# Patient Record
Sex: Female | Born: 1945 | Race: White | Hispanic: No | Smoking: Former smoker
Health system: Southern US, Community
[De-identification: ages and names within clinical notes are randomized; demographics above are authoritative.]

## PROBLEM LIST (undated history)

## (undated) DIAGNOSIS — E669 Obesity, unspecified: Secondary | ICD-10-CM

## (undated) DIAGNOSIS — D235 Other benign neoplasm of skin of trunk: Secondary | ICD-10-CM

## (undated) DIAGNOSIS — I5032 Chronic diastolic (congestive) heart failure: Secondary | ICD-10-CM

## (undated) DIAGNOSIS — I10 Essential (primary) hypertension: Secondary | ICD-10-CM

## (undated) DIAGNOSIS — F329 Major depressive disorder, single episode, unspecified: Secondary | ICD-10-CM

## (undated) DIAGNOSIS — E785 Hyperlipidemia, unspecified: Secondary | ICD-10-CM

## (undated) DIAGNOSIS — F32A Depression, unspecified: Secondary | ICD-10-CM

## (undated) DIAGNOSIS — N83209 Unspecified ovarian cyst, unspecified side: Secondary | ICD-10-CM

## (undated) DIAGNOSIS — K5792 Diverticulitis of intestine, part unspecified, without perforation or abscess without bleeding: Secondary | ICD-10-CM

## (undated) DIAGNOSIS — T7840XA Allergy, unspecified, initial encounter: Secondary | ICD-10-CM

## (undated) HISTORY — DX: Diverticulitis of intestine, part unspecified, without perforation or abscess without bleeding: K57.92

## (undated) HISTORY — DX: Major depressive disorder, single episode, unspecified: F32.9

## (undated) HISTORY — DX: Hyperlipidemia, unspecified: E78.5

## (undated) HISTORY — PX: CHOLECYSTECTOMY: SHX55

## (undated) HISTORY — PX: ABDOMINAL HYSTERECTOMY: SHX81

## (undated) HISTORY — DX: Essential (primary) hypertension: I10

## (undated) HISTORY — DX: Allergy, unspecified, initial encounter: T78.40XA

## (undated) HISTORY — DX: Other benign neoplasm of skin of trunk: D23.5

## (undated) HISTORY — DX: Depression, unspecified: F32.A

## (undated) HISTORY — DX: Unspecified ovarian cyst, unspecified side: N83.209

---

## 1981-06-06 HISTORY — PX: ANKLE FRACTURE SURGERY: SHX122

## 1995-09-05 HISTORY — PX: DOBUTAMINE STRESS ECHO: SHX5426

## 1998-06-06 HISTORY — PX: KNEE CARTILAGE SURGERY: SHX688

## 2003-08-01 ENCOUNTER — Encounter: Payer: Self-pay | Admitting: Internal Medicine

## 2004-04-09 ENCOUNTER — Ambulatory Visit: Payer: Self-pay | Admitting: Internal Medicine

## 2004-04-20 ENCOUNTER — Ambulatory Visit: Payer: Self-pay | Admitting: Professional

## 2004-05-04 ENCOUNTER — Ambulatory Visit: Payer: Self-pay | Admitting: Professional

## 2004-05-04 ENCOUNTER — Ambulatory Visit: Payer: Self-pay | Admitting: Internal Medicine

## 2004-05-11 ENCOUNTER — Ambulatory Visit: Payer: Self-pay | Admitting: Professional

## 2004-05-18 ENCOUNTER — Ambulatory Visit: Payer: Self-pay | Admitting: Professional

## 2004-05-25 ENCOUNTER — Ambulatory Visit: Payer: Self-pay | Admitting: Professional

## 2004-05-28 ENCOUNTER — Ambulatory Visit: Payer: Self-pay | Admitting: Internal Medicine

## 2004-06-15 ENCOUNTER — Ambulatory Visit: Payer: Self-pay | Admitting: Professional

## 2004-06-29 ENCOUNTER — Ambulatory Visit: Payer: Self-pay | Admitting: Professional

## 2004-07-05 ENCOUNTER — Ambulatory Visit (HOSPITAL_COMMUNITY): Admission: RE | Admit: 2004-07-05 | Discharge: 2004-07-05 | Payer: Self-pay | Admitting: Gynecology

## 2004-07-13 ENCOUNTER — Ambulatory Visit: Payer: Self-pay | Admitting: Professional

## 2004-07-15 ENCOUNTER — Ambulatory Visit: Payer: Self-pay | Admitting: Internal Medicine

## 2004-07-20 ENCOUNTER — Ambulatory Visit: Payer: Self-pay | Admitting: Professional

## 2004-08-03 ENCOUNTER — Ambulatory Visit: Payer: Self-pay | Admitting: Professional

## 2004-08-10 ENCOUNTER — Ambulatory Visit: Payer: Self-pay | Admitting: Professional

## 2004-08-24 ENCOUNTER — Ambulatory Visit: Payer: Self-pay | Admitting: Professional

## 2004-08-30 ENCOUNTER — Ambulatory Visit: Payer: Self-pay | Admitting: Internal Medicine

## 2004-08-31 ENCOUNTER — Ambulatory Visit: Payer: Self-pay | Admitting: Professional

## 2004-09-07 ENCOUNTER — Ambulatory Visit: Payer: Self-pay | Admitting: Professional

## 2004-09-21 ENCOUNTER — Ambulatory Visit: Payer: Self-pay | Admitting: Professional

## 2004-09-28 ENCOUNTER — Ambulatory Visit: Payer: Self-pay | Admitting: Professional

## 2004-10-05 ENCOUNTER — Ambulatory Visit: Payer: Self-pay | Admitting: Professional

## 2004-10-12 ENCOUNTER — Ambulatory Visit: Payer: Self-pay | Admitting: Professional

## 2004-10-19 ENCOUNTER — Ambulatory Visit: Payer: Self-pay | Admitting: Professional

## 2004-10-26 ENCOUNTER — Ambulatory Visit: Payer: Self-pay | Admitting: Professional

## 2004-11-02 ENCOUNTER — Ambulatory Visit: Payer: Self-pay | Admitting: Professional

## 2004-11-09 ENCOUNTER — Ambulatory Visit: Payer: Self-pay | Admitting: Professional

## 2004-11-23 ENCOUNTER — Ambulatory Visit: Payer: Self-pay | Admitting: Professional

## 2004-12-14 ENCOUNTER — Ambulatory Visit: Payer: Self-pay | Admitting: Professional

## 2004-12-20 ENCOUNTER — Ambulatory Visit: Payer: Self-pay | Admitting: Internal Medicine

## 2004-12-28 ENCOUNTER — Ambulatory Visit: Payer: Self-pay | Admitting: Professional

## 2005-01-04 ENCOUNTER — Ambulatory Visit: Payer: Self-pay | Admitting: Professional

## 2005-01-11 ENCOUNTER — Ambulatory Visit: Payer: Self-pay | Admitting: Professional

## 2005-01-25 ENCOUNTER — Ambulatory Visit: Payer: Self-pay | Admitting: Professional

## 2005-02-01 ENCOUNTER — Ambulatory Visit: Payer: Self-pay | Admitting: Professional

## 2005-02-15 ENCOUNTER — Ambulatory Visit: Payer: Self-pay | Admitting: Professional

## 2005-03-01 ENCOUNTER — Ambulatory Visit: Payer: Self-pay | Admitting: Professional

## 2005-03-06 HISTORY — PX: INCONTINENCE SURGERY: SHX676

## 2005-03-15 ENCOUNTER — Ambulatory Visit: Payer: Self-pay | Admitting: Professional

## 2005-03-16 ENCOUNTER — Encounter: Payer: Self-pay | Admitting: Internal Medicine

## 2005-03-16 ENCOUNTER — Ambulatory Visit (HOSPITAL_COMMUNITY): Admission: RE | Admit: 2005-03-16 | Discharge: 2005-03-16 | Payer: Self-pay | Admitting: Gynecology

## 2005-03-18 ENCOUNTER — Encounter (INDEPENDENT_AMBULATORY_CARE_PROVIDER_SITE_OTHER): Payer: Self-pay | Admitting: Specialist

## 2005-03-19 ENCOUNTER — Inpatient Hospital Stay (HOSPITAL_COMMUNITY): Admission: RE | Admit: 2005-03-19 | Discharge: 2005-03-20 | Payer: Self-pay | Admitting: Gynecology

## 2005-03-23 ENCOUNTER — Ambulatory Visit: Payer: Self-pay | Admitting: Internal Medicine

## 2005-03-29 ENCOUNTER — Ambulatory Visit: Payer: Self-pay | Admitting: Professional

## 2005-04-07 ENCOUNTER — Ambulatory Visit: Payer: Self-pay | Admitting: Internal Medicine

## 2005-04-12 ENCOUNTER — Ambulatory Visit: Payer: Self-pay | Admitting: Professional

## 2005-05-03 ENCOUNTER — Ambulatory Visit: Payer: Self-pay | Admitting: Professional

## 2005-05-09 ENCOUNTER — Ambulatory Visit: Payer: Self-pay | Admitting: Cardiology

## 2005-05-10 ENCOUNTER — Ambulatory Visit: Payer: Self-pay | Admitting: Internal Medicine

## 2005-05-10 ENCOUNTER — Ambulatory Visit: Payer: Self-pay

## 2005-05-17 ENCOUNTER — Ambulatory Visit: Payer: Self-pay | Admitting: Professional

## 2005-06-06 DIAGNOSIS — D235 Other benign neoplasm of skin of trunk: Secondary | ICD-10-CM

## 2005-06-06 HISTORY — DX: Other benign neoplasm of skin of trunk: D23.5

## 2005-06-07 ENCOUNTER — Ambulatory Visit: Payer: Self-pay | Admitting: Professional

## 2005-06-21 ENCOUNTER — Ambulatory Visit: Payer: Self-pay | Admitting: Professional

## 2005-07-05 ENCOUNTER — Ambulatory Visit: Payer: Self-pay | Admitting: Professional

## 2005-07-12 ENCOUNTER — Ambulatory Visit: Payer: Self-pay | Admitting: Professional

## 2005-08-02 ENCOUNTER — Ambulatory Visit: Payer: Self-pay | Admitting: Professional

## 2005-08-16 ENCOUNTER — Ambulatory Visit: Payer: Self-pay | Admitting: Professional

## 2005-08-30 ENCOUNTER — Ambulatory Visit: Payer: Self-pay | Admitting: Professional

## 2005-09-07 ENCOUNTER — Ambulatory Visit (HOSPITAL_COMMUNITY): Admission: RE | Admit: 2005-09-07 | Discharge: 2005-09-07 | Payer: Self-pay | Admitting: Gynecology

## 2005-09-13 ENCOUNTER — Ambulatory Visit: Payer: Self-pay | Admitting: Professional

## 2005-09-27 ENCOUNTER — Ambulatory Visit: Payer: Self-pay | Admitting: Professional

## 2005-10-11 ENCOUNTER — Ambulatory Visit: Payer: Self-pay | Admitting: Professional

## 2005-10-17 ENCOUNTER — Ambulatory Visit: Payer: Self-pay | Admitting: Internal Medicine

## 2005-11-01 ENCOUNTER — Ambulatory Visit: Payer: Self-pay | Admitting: Internal Medicine

## 2005-11-08 ENCOUNTER — Ambulatory Visit: Payer: Self-pay | Admitting: Professional

## 2005-12-06 ENCOUNTER — Ambulatory Visit: Payer: Self-pay | Admitting: Professional

## 2005-12-27 ENCOUNTER — Ambulatory Visit: Payer: Self-pay | Admitting: Professional

## 2006-01-10 ENCOUNTER — Ambulatory Visit: Payer: Self-pay | Admitting: Pulmonary Disease

## 2006-01-17 ENCOUNTER — Ambulatory Visit: Payer: Self-pay | Admitting: Professional

## 2006-01-30 ENCOUNTER — Ambulatory Visit: Payer: Self-pay | Admitting: Internal Medicine

## 2006-01-31 ENCOUNTER — Ambulatory Visit: Payer: Self-pay | Admitting: Internal Medicine

## 2006-02-07 ENCOUNTER — Ambulatory Visit: Payer: Self-pay | Admitting: Professional

## 2006-02-14 ENCOUNTER — Ambulatory Visit (HOSPITAL_BASED_OUTPATIENT_CLINIC_OR_DEPARTMENT_OTHER): Admission: RE | Admit: 2006-02-14 | Discharge: 2006-02-14 | Payer: Self-pay | Admitting: Pulmonary Disease

## 2006-02-17 ENCOUNTER — Ambulatory Visit: Payer: Self-pay | Admitting: Pulmonary Disease

## 2006-02-22 ENCOUNTER — Ambulatory Visit: Payer: Self-pay | Admitting: Internal Medicine

## 2006-03-06 ENCOUNTER — Encounter: Payer: Self-pay | Admitting: Internal Medicine

## 2006-03-06 ENCOUNTER — Ambulatory Visit: Payer: Self-pay | Admitting: Pulmonary Disease

## 2006-03-14 ENCOUNTER — Ambulatory Visit: Payer: Self-pay | Admitting: Professional

## 2006-03-15 ENCOUNTER — Encounter: Payer: Self-pay | Admitting: Internal Medicine

## 2006-03-28 ENCOUNTER — Ambulatory Visit: Payer: Self-pay | Admitting: Pulmonary Disease

## 2006-04-04 ENCOUNTER — Ambulatory Visit: Payer: Self-pay | Admitting: Professional

## 2006-05-25 ENCOUNTER — Ambulatory Visit: Payer: Self-pay | Admitting: Internal Medicine

## 2006-06-12 ENCOUNTER — Ambulatory Visit: Payer: Self-pay | Admitting: Professional

## 2006-06-20 ENCOUNTER — Ambulatory Visit: Payer: Self-pay | Admitting: Professional

## 2006-06-23 ENCOUNTER — Ambulatory Visit: Payer: Self-pay | Admitting: Internal Medicine

## 2006-07-25 ENCOUNTER — Ambulatory Visit: Payer: Self-pay | Admitting: Internal Medicine

## 2006-07-25 LAB — CONVERTED CEMR LAB
BUN: 15 mg/dL (ref 6–23)
Basophils Absolute: 0 10*3/uL (ref 0.0–0.1)
Basophils Relative: 0.6 % (ref 0.0–1.0)
CO2: 34 meq/L — ABNORMAL HIGH (ref 19–32)
Calcium: 10 mg/dL (ref 8.4–10.5)
Chloride: 100 meq/L (ref 96–112)
Creatinine, Ser: 0.9 mg/dL (ref 0.4–1.2)
Eosinophils Absolute: 0.3 10*3/uL (ref 0.0–0.6)
Eosinophils Relative: 3.8 % (ref 0.0–5.0)
GFR calc Af Amer: 83 mL/min
GFR calc non Af Amer: 68 mL/min
Glucose, Bld: 110 mg/dL — ABNORMAL HIGH (ref 70–99)
HCT: 41 % (ref 36.0–46.0)
Hemoglobin: 14.1 g/dL (ref 12.0–15.0)
Lymphocytes Relative: 20.6 % (ref 12.0–46.0)
MCHC: 34.4 g/dL (ref 30.0–36.0)
MCV: 83.9 fL (ref 78.0–100.0)
Monocytes Absolute: 0.5 10*3/uL (ref 0.2–0.7)
Monocytes Relative: 6.9 % (ref 3.0–11.0)
Neutro Abs: 5.4 10*3/uL (ref 1.4–7.7)
Neutrophils Relative %: 68.1 % (ref 43.0–77.0)
Platelets: 350 10*3/uL (ref 150–400)
Potassium: 4.6 meq/L (ref 3.5–5.1)
Pro B Natriuretic peptide (BNP): 23 pg/mL (ref 0.0–100.0)
RBC: 4.88 M/uL (ref 3.87–5.11)
RDW: 13.4 % (ref 11.5–14.6)
Sodium: 143 meq/L (ref 135–145)
WBC: 7.8 10*3/uL (ref 4.5–10.5)

## 2006-08-03 ENCOUNTER — Ambulatory Visit: Payer: Self-pay

## 2006-08-03 ENCOUNTER — Encounter: Payer: Self-pay | Admitting: Cardiology

## 2006-08-08 ENCOUNTER — Ambulatory Visit: Payer: Self-pay | Admitting: Professional

## 2006-08-22 ENCOUNTER — Ambulatory Visit: Payer: Self-pay | Admitting: Internal Medicine

## 2006-09-15 ENCOUNTER — Ambulatory Visit: Payer: Self-pay | Admitting: Family Medicine

## 2006-10-16 DIAGNOSIS — N83209 Unspecified ovarian cyst, unspecified side: Secondary | ICD-10-CM | POA: Insufficient documentation

## 2006-10-16 DIAGNOSIS — F329 Major depressive disorder, single episode, unspecified: Secondary | ICD-10-CM

## 2006-10-16 DIAGNOSIS — I10 Essential (primary) hypertension: Secondary | ICD-10-CM | POA: Insufficient documentation

## 2006-10-16 DIAGNOSIS — K5730 Diverticulosis of large intestine without perforation or abscess without bleeding: Secondary | ICD-10-CM | POA: Insufficient documentation

## 2006-10-16 DIAGNOSIS — F3289 Other specified depressive episodes: Secondary | ICD-10-CM | POA: Insufficient documentation

## 2006-10-16 DIAGNOSIS — J309 Allergic rhinitis, unspecified: Secondary | ICD-10-CM | POA: Insufficient documentation

## 2006-10-23 ENCOUNTER — Ambulatory Visit: Payer: Self-pay | Admitting: Internal Medicine

## 2006-11-06 ENCOUNTER — Ambulatory Visit: Payer: Self-pay | Admitting: Pulmonary Disease

## 2006-12-05 ENCOUNTER — Ambulatory Visit: Payer: Self-pay | Admitting: Pulmonary Disease

## 2007-01-02 ENCOUNTER — Ambulatory Visit: Payer: Self-pay | Admitting: Professional

## 2007-01-08 ENCOUNTER — Ambulatory Visit: Payer: Self-pay | Admitting: Internal Medicine

## 2007-01-09 ENCOUNTER — Ambulatory Visit: Payer: Self-pay | Admitting: Internal Medicine

## 2007-01-12 LAB — CONVERTED CEMR LAB
BUN: 32 mg/dL — ABNORMAL HIGH (ref 6–23)
CO2: 32 meq/L (ref 19–32)
Calcium: 9.2 mg/dL (ref 8.4–10.5)
Chloride: 102 meq/L (ref 96–112)
Creatinine, Ser: 0.9 mg/dL (ref 0.4–1.2)
GFR calc Af Amer: 83 mL/min
GFR calc non Af Amer: 68 mL/min
Glucose, Bld: 129 mg/dL — ABNORMAL HIGH (ref 70–99)
Potassium: 4.1 meq/L (ref 3.5–5.1)
Sodium: 140 meq/L (ref 135–145)

## 2007-01-29 ENCOUNTER — Encounter: Admission: RE | Admit: 2007-01-29 | Discharge: 2007-01-29 | Payer: Self-pay | Admitting: Gynecology

## 2007-02-15 ENCOUNTER — Encounter: Payer: Self-pay | Admitting: Pulmonary Disease

## 2007-03-07 ENCOUNTER — Ambulatory Visit: Payer: Self-pay | Admitting: Internal Medicine

## 2007-04-09 ENCOUNTER — Telehealth (INDEPENDENT_AMBULATORY_CARE_PROVIDER_SITE_OTHER): Payer: Self-pay | Admitting: *Deleted

## 2007-04-10 DIAGNOSIS — G4733 Obstructive sleep apnea (adult) (pediatric): Secondary | ICD-10-CM | POA: Insufficient documentation

## 2007-04-20 ENCOUNTER — Encounter: Payer: Self-pay | Admitting: Internal Medicine

## 2007-05-07 ENCOUNTER — Telehealth: Payer: Self-pay | Admitting: Pulmonary Disease

## 2007-05-15 ENCOUNTER — Ambulatory Visit: Payer: Self-pay | Admitting: Professional

## 2007-05-22 ENCOUNTER — Encounter: Admission: RE | Admit: 2007-05-22 | Discharge: 2007-05-22 | Payer: Self-pay | Admitting: *Deleted

## 2007-05-22 ENCOUNTER — Ambulatory Visit (HOSPITAL_COMMUNITY): Admission: RE | Admit: 2007-05-22 | Discharge: 2007-05-22 | Payer: Self-pay | Admitting: *Deleted

## 2007-05-23 ENCOUNTER — Ambulatory Visit: Payer: Self-pay | Admitting: Pulmonary Disease

## 2007-05-24 ENCOUNTER — Ambulatory Visit (HOSPITAL_COMMUNITY): Admission: RE | Admit: 2007-05-24 | Discharge: 2007-05-24 | Payer: Self-pay | Admitting: *Deleted

## 2007-05-30 ENCOUNTER — Telehealth (INDEPENDENT_AMBULATORY_CARE_PROVIDER_SITE_OTHER): Payer: Self-pay | Admitting: *Deleted

## 2007-06-27 ENCOUNTER — Encounter: Payer: Self-pay | Admitting: Internal Medicine

## 2007-07-18 ENCOUNTER — Ambulatory Visit: Payer: Self-pay | Admitting: Internal Medicine

## 2007-08-28 ENCOUNTER — Encounter: Admission: RE | Admit: 2007-08-28 | Discharge: 2007-08-28 | Payer: Self-pay

## 2007-09-05 HISTORY — PX: LAPAROSCOPIC GASTRIC BANDING: SHX1100

## 2007-09-11 ENCOUNTER — Ambulatory Visit (HOSPITAL_COMMUNITY): Admission: RE | Admit: 2007-09-11 | Discharge: 2007-09-12 | Payer: Self-pay | Admitting: Surgery

## 2007-09-11 ENCOUNTER — Encounter: Payer: Self-pay | Admitting: Internal Medicine

## 2007-09-19 ENCOUNTER — Telehealth: Payer: Self-pay | Admitting: Internal Medicine

## 2007-11-16 ENCOUNTER — Ambulatory Visit: Payer: Self-pay | Admitting: Internal Medicine

## 2007-11-16 LAB — CONVERTED CEMR LAB
ALT: 22 units/L (ref 0–35)
AST: 26 units/L (ref 0–37)
Albumin: 3.9 g/dL (ref 3.5–5.2)
Alkaline Phosphatase: 51 units/L (ref 39–117)
BUN: 21 mg/dL (ref 6–23)
Bilirubin, Direct: 0.1 mg/dL (ref 0.0–0.3)
CO2: 30 meq/L (ref 19–32)
Calcium: 9.4 mg/dL (ref 8.4–10.5)
Chloride: 103 meq/L (ref 96–112)
Cholesterol: 205 mg/dL (ref 0–200)
Creatinine, Ser: 0.8 mg/dL (ref 0.4–1.2)
Direct LDL: 147.4 mg/dL
GFR calc Af Amer: 94 mL/min
GFR calc non Af Amer: 78 mL/min
Glucose, Bld: 114 mg/dL — ABNORMAL HIGH (ref 70–99)
HDL: 40.7 mg/dL (ref >39.0)
Potassium: 4.4 meq/L (ref 3.5–5.1)
Sodium: 142 meq/L (ref 135–145)
TSH: 2.04 microintl units/mL (ref 0.35–5.50)
Total Bilirubin: 0.9 mg/dL (ref 0.3–1.2)
Total CHOL/HDL Ratio: 5
Total Protein: 7.6 g/dL (ref 6.0–8.3)
Triglycerides: 129 mg/dL (ref 0–149)
VLDL: 26 mg/dL (ref 0–40)

## 2007-11-22 ENCOUNTER — Ambulatory Visit: Payer: Self-pay | Admitting: Internal Medicine

## 2007-11-22 DIAGNOSIS — R7301 Impaired fasting glucose: Secondary | ICD-10-CM | POA: Insufficient documentation

## 2008-02-27 ENCOUNTER — Encounter: Payer: Self-pay | Admitting: Internal Medicine

## 2008-04-11 ENCOUNTER — Encounter: Payer: Self-pay | Admitting: Pulmonary Disease

## 2008-04-11 ENCOUNTER — Telehealth: Payer: Self-pay | Admitting: Internal Medicine

## 2008-04-14 ENCOUNTER — Encounter: Payer: Self-pay | Admitting: Internal Medicine

## 2008-05-14 ENCOUNTER — Encounter (INDEPENDENT_AMBULATORY_CARE_PROVIDER_SITE_OTHER): Payer: Self-pay | Admitting: *Deleted

## 2008-06-24 ENCOUNTER — Encounter: Payer: Self-pay | Admitting: Internal Medicine

## 2008-06-24 ENCOUNTER — Ambulatory Visit (HOSPITAL_COMMUNITY): Admission: RE | Admit: 2008-06-24 | Discharge: 2008-06-24 | Payer: Self-pay | Admitting: Family Medicine

## 2008-07-14 ENCOUNTER — Ambulatory Visit: Payer: Self-pay | Admitting: Professional

## 2008-11-19 ENCOUNTER — Encounter: Admission: RE | Admit: 2008-11-19 | Discharge: 2008-11-19 | Payer: Self-pay | Admitting: Surgery

## 2008-11-29 ENCOUNTER — Encounter: Payer: Self-pay | Admitting: Internal Medicine

## 2008-12-11 ENCOUNTER — Telehealth: Payer: Self-pay | Admitting: Internal Medicine

## 2008-12-16 ENCOUNTER — Telehealth: Payer: Self-pay | Admitting: Internal Medicine

## 2008-12-26 ENCOUNTER — Ambulatory Visit: Payer: Self-pay | Admitting: Internal Medicine

## 2008-12-26 DIAGNOSIS — R32 Unspecified urinary incontinence: Secondary | ICD-10-CM | POA: Insufficient documentation

## 2008-12-30 LAB — CONVERTED CEMR LAB
ALT: 21 units/L (ref 0–35)
AST: 28 units/L (ref 0–37)
Albumin: 3.7 g/dL (ref 3.5–5.2)
Alkaline Phosphatase: 52 units/L (ref 39–117)
BUN: 20 mg/dL (ref 6–23)
Basophils Absolute: 0 10*3/uL (ref 0.0–0.1)
Basophils Relative: 0.7 % (ref 0.0–3.0)
Bilirubin, Direct: 0 mg/dL (ref 0.0–0.3)
CO2: 31 meq/L (ref 19–32)
Calcium: 9 mg/dL (ref 8.4–10.5)
Chloride: 107 meq/L (ref 96–112)
Cholesterol: 205 mg/dL — ABNORMAL HIGH (ref 0–200)
Creatinine, Ser: 0.9 mg/dL (ref 0.4–1.2)
Direct LDL: 142.6 mg/dL
Eosinophils Absolute: 0.3 10*3/uL (ref 0.0–0.7)
Eosinophils Relative: 4.8 % (ref 0.0–5.0)
Glucose, Bld: 98 mg/dL (ref 70–99)
HCT: 40.9 % (ref 36.0–46.0)
HDL: 37.6 mg/dL — ABNORMAL LOW (ref >39.00)
Hemoglobin: 13.9 g/dL (ref 12.0–15.0)
Lymphocytes Relative: 24.6 % (ref 12.0–46.0)
Lymphs Abs: 1.5 10*3/uL (ref 0.7–4.0)
MCHC: 33.9 g/dL (ref 30.0–36.0)
MCV: 87.5 fL (ref 78.0–100.0)
Monocytes Absolute: 0.5 10*3/uL (ref 0.1–1.0)
Monocytes Relative: 8.1 % (ref 3.0–12.0)
Neutro Abs: 3.7 10*3/uL (ref 1.4–7.7)
Neutrophils Relative %: 61.8 % (ref 43.0–77.0)
Phosphorus: 3.1 mg/dL (ref 2.3–4.6)
Platelets: 308 10*3/uL (ref 150.0–400.0)
Potassium: 4.5 meq/L (ref 3.5–5.1)
RBC: 4.67 M/uL (ref 3.87–5.11)
RDW: 14 % (ref 11.5–14.6)
Sodium: 142 meq/L (ref 135–145)
TSH: 1.29 microintl units/mL (ref 0.35–5.50)
Total Bilirubin: 1 mg/dL (ref 0.3–1.2)
Total CHOL/HDL Ratio: 5
Total Protein: 7.2 g/dL (ref 6.0–8.3)
Triglycerides: 219 mg/dL — ABNORMAL HIGH (ref 0.0–149.0)
VLDL: 43.8 mg/dL — ABNORMAL HIGH (ref 0.0–40.0)
WBC: 6 10*3/uL (ref 4.5–10.5)

## 2009-01-01 ENCOUNTER — Encounter: Payer: Self-pay | Admitting: Internal Medicine

## 2009-01-06 DIAGNOSIS — R0602 Shortness of breath: Secondary | ICD-10-CM | POA: Insufficient documentation

## 2009-02-16 ENCOUNTER — Ambulatory Visit: Payer: Self-pay | Admitting: Internal Medicine

## 2009-02-27 ENCOUNTER — Telehealth: Payer: Self-pay | Admitting: Internal Medicine

## 2009-03-16 ENCOUNTER — Ambulatory Visit: Payer: Self-pay | Admitting: Family Medicine

## 2009-03-16 DIAGNOSIS — J209 Acute bronchitis, unspecified: Secondary | ICD-10-CM | POA: Insufficient documentation

## 2009-04-16 ENCOUNTER — Telehealth: Payer: Self-pay | Admitting: Internal Medicine

## 2009-06-15 IMAGING — CR DG ABDOMEN 1V
2 series · 2 of 2 positions shown · non-contrast
Comparison: None

CLINICAL DATA: Postop gastric banding.

ABDOMEN - 1 VIEW

[t abdomen supine * (1 of 2)]
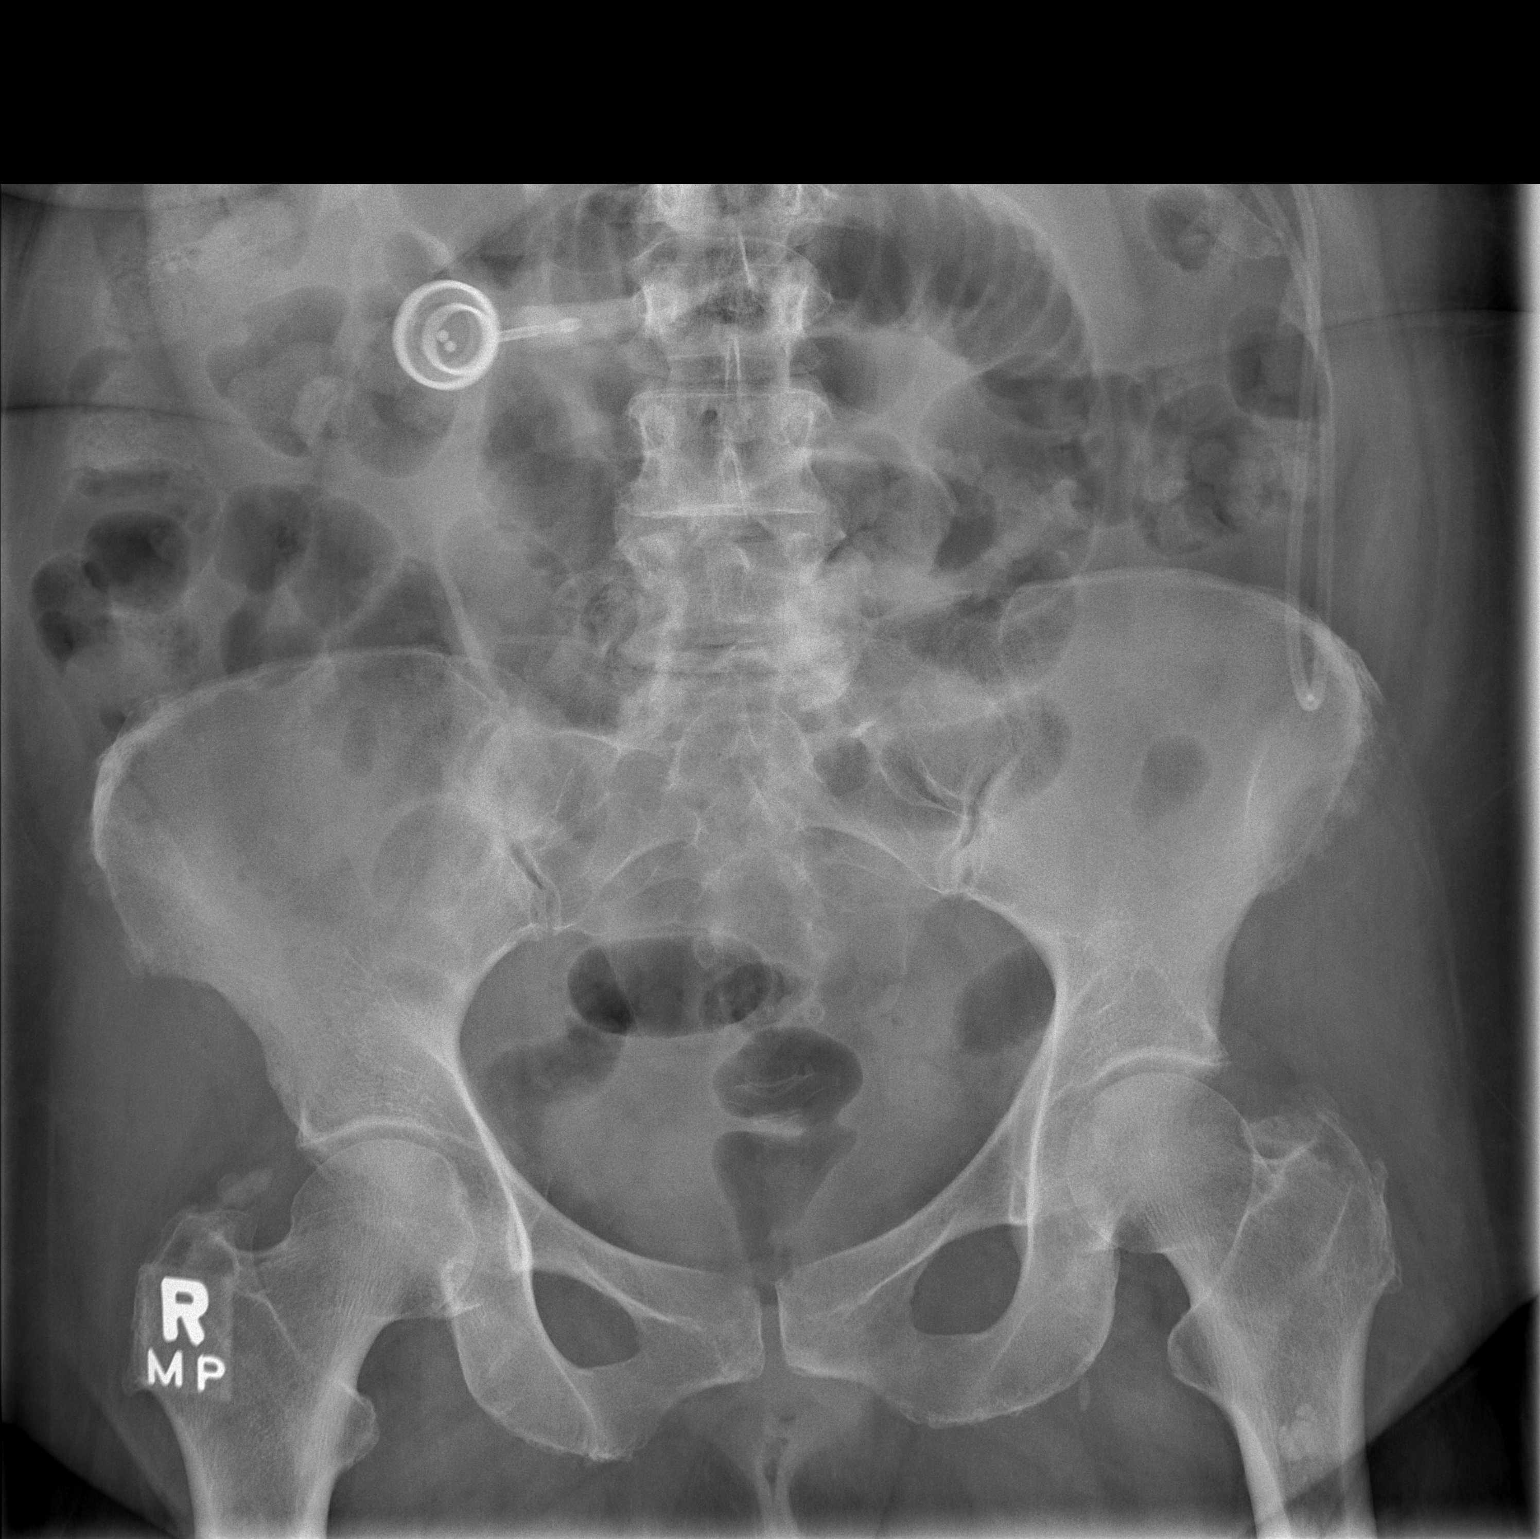

[t abdomen supine * (2 of 2)]
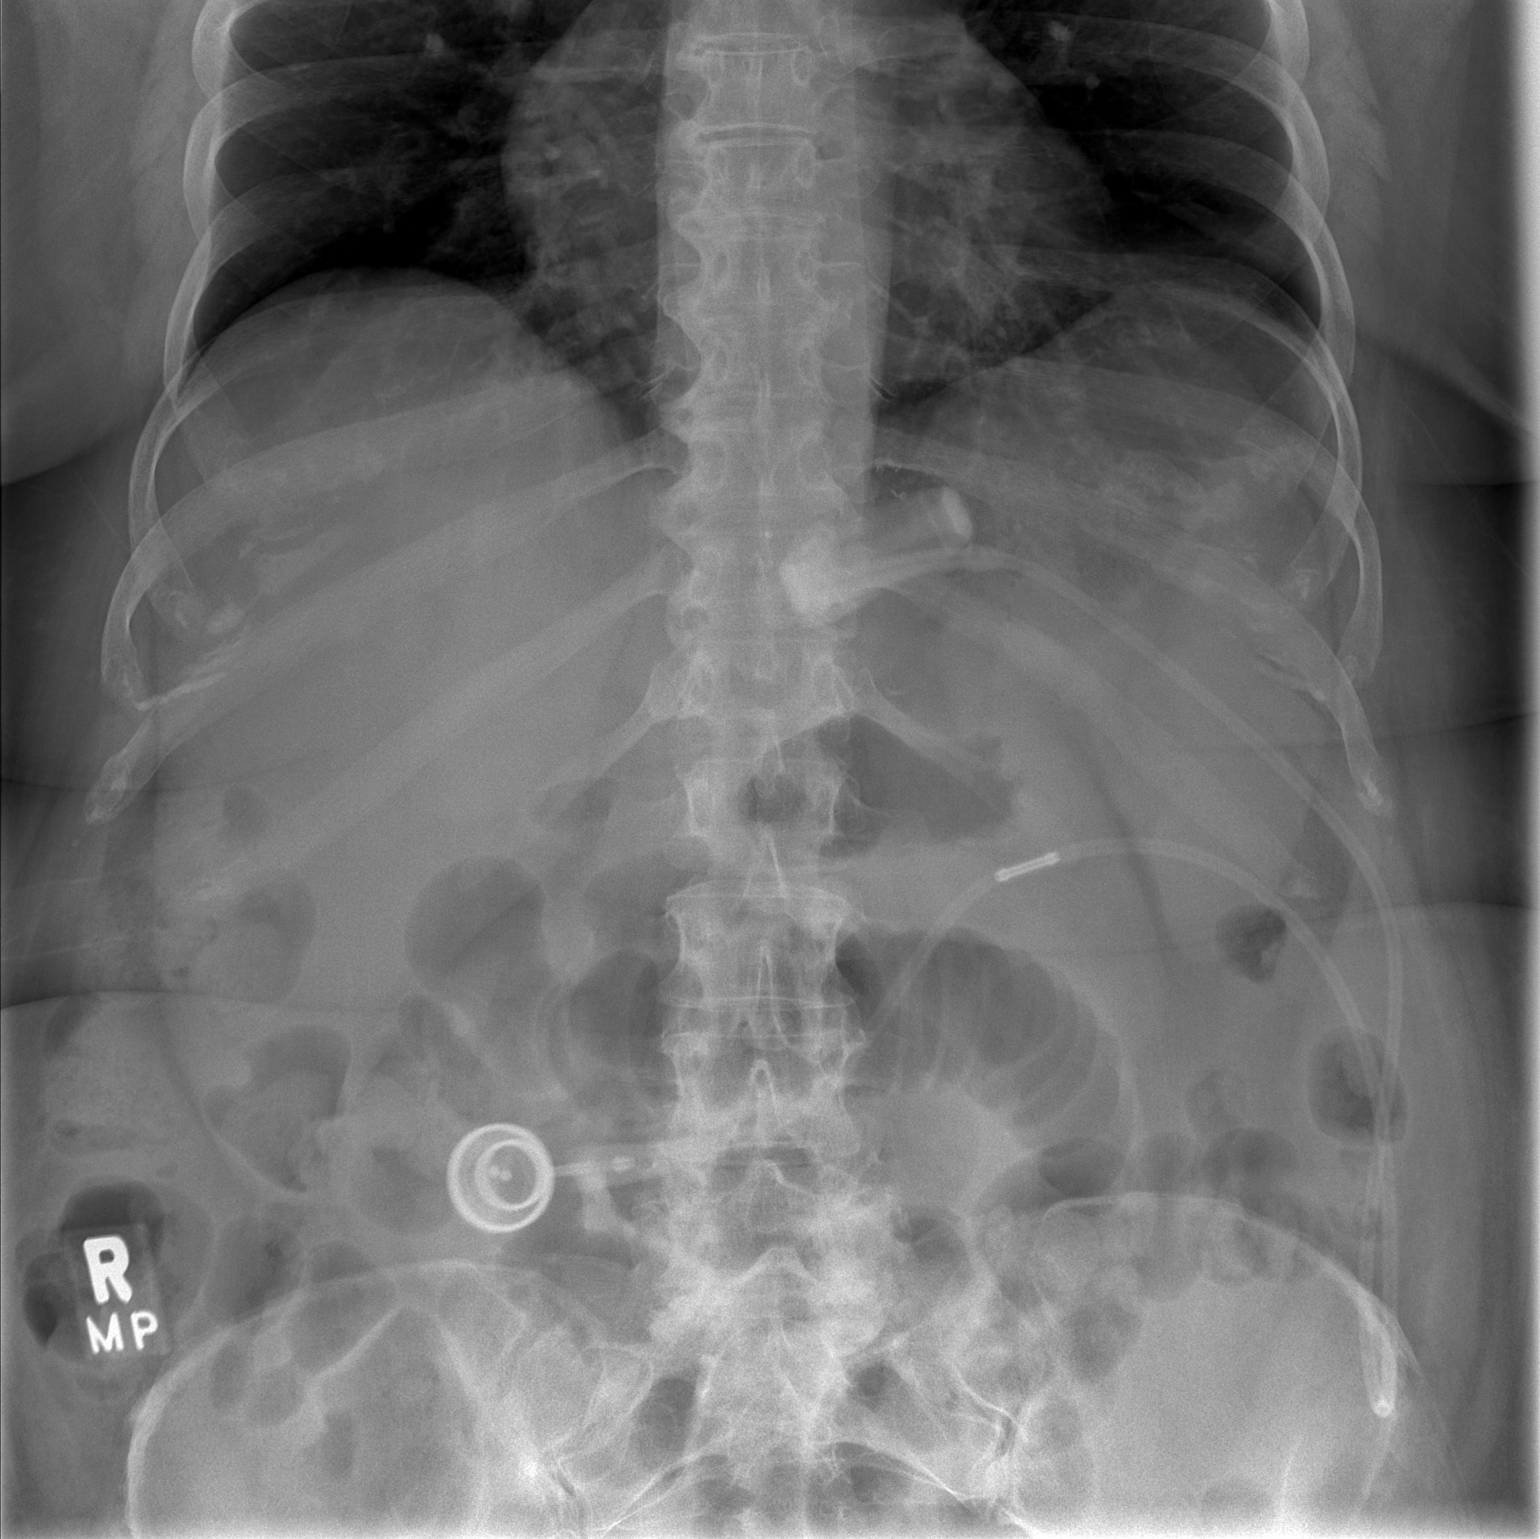

[2 of 2 positions shown; findings below may reference images not displayed]

FINDINGS: The patient is status post laparoscopic gastric banding
with band positions at the 2 o'clock and 8 o'clock positions.  Mild
gaseous distension of small bowel.  Gas and stool are seen in the
colon.
IMPRESSION: Gastric banding as above.

## 2009-06-17 ENCOUNTER — Ambulatory Visit: Payer: Self-pay | Admitting: Internal Medicine

## 2009-06-22 LAB — CONVERTED CEMR LAB
ALT: 20 units/L (ref 0–35)
AST: 22 units/L (ref 0–37)
Albumin: 4.4 g/dL (ref 3.5–5.2)
Alkaline Phosphatase: 52 units/L (ref 39–117)
BUN: 23 mg/dL (ref 6–23)
Basophils Absolute: 0.1 10*3/uL (ref 0.0–0.1)
Basophils Relative: 1.7 % (ref 0.0–3.0)
Bilirubin, Direct: 0.2 mg/dL (ref 0.0–0.3)
CO2: 28 meq/L (ref 19–32)
Calcium: 9.8 mg/dL (ref 8.4–10.5)
Chloride: 98 meq/L (ref 96–112)
Cholesterol: 231 mg/dL — ABNORMAL HIGH (ref 0–200)
Creatinine, Ser: 1.3 mg/dL — ABNORMAL HIGH (ref 0.4–1.2)
Direct LDL: 174.4 mg/dL
Eosinophils Absolute: 0.1 10*3/uL (ref 0.0–0.7)
Eosinophils Relative: 1.4 % (ref 0.0–5.0)
GFR calc non Af Amer: 44.21 mL/min (ref >60)
Glucose, Bld: 91 mg/dL (ref 70–99)
HCT: 40.3 % (ref 36.0–46.0)
HDL: 40.3 mg/dL (ref >39.00)
Hemoglobin: 13.5 g/dL (ref 12.0–15.0)
Hgb A1c MFr Bld: 5.4 % (ref 4.6–6.5)
Lymphocytes Relative: 15.7 % (ref 12.0–46.0)
Lymphs Abs: 1.3 10*3/uL (ref 0.7–4.0)
MCHC: 33.5 g/dL (ref 30.0–36.0)
MCV: 86.2 fL (ref 78.0–100.0)
Monocytes Absolute: 0.3 10*3/uL (ref 0.1–1.0)
Monocytes Relative: 3.6 % (ref 3.0–12.0)
Neutro Abs: 6.5 10*3/uL (ref 1.4–7.7)
Neutrophils Relative %: 77.6 % — ABNORMAL HIGH (ref 43.0–77.0)
Phosphorus: 5.2 mg/dL — ABNORMAL HIGH (ref 2.3–4.6)
Platelets: 307 10*3/uL (ref 150.0–400.0)
Potassium: 4.9 meq/L (ref 3.5–5.1)
RBC: 4.68 M/uL (ref 3.87–5.11)
RDW: 14.2 % (ref 11.5–14.6)
Sodium: 137 meq/L (ref 135–145)
TSH: 2.03 microintl units/mL (ref 0.35–5.50)
Total Bilirubin: 1.3 mg/dL — ABNORMAL HIGH (ref 0.3–1.2)
Total CHOL/HDL Ratio: 6
Total Protein: 7.7 g/dL (ref 6.0–8.3)
Triglycerides: 135 mg/dL (ref 0.0–149.0)
VLDL: 27 mg/dL (ref 0.0–40.0)
WBC: 8.3 10*3/uL (ref 4.5–10.5)

## 2009-06-29 ENCOUNTER — Ambulatory Visit (HOSPITAL_COMMUNITY): Admission: RE | Admit: 2009-06-29 | Discharge: 2009-06-29 | Payer: Self-pay | Admitting: Internal Medicine

## 2009-06-29 LAB — HM MAMMOGRAPHY

## 2009-06-30 ENCOUNTER — Encounter: Payer: Self-pay | Admitting: Internal Medicine

## 2009-10-23 ENCOUNTER — Ambulatory Visit: Payer: Self-pay | Admitting: Internal Medicine

## 2009-10-23 DIAGNOSIS — J019 Acute sinusitis, unspecified: Secondary | ICD-10-CM | POA: Insufficient documentation

## 2010-03-01 ENCOUNTER — Encounter: Payer: Self-pay | Admitting: Internal Medicine

## 2010-03-22 ENCOUNTER — Telehealth: Payer: Self-pay | Admitting: Internal Medicine

## 2010-05-17 ENCOUNTER — Encounter: Payer: Self-pay | Admitting: Internal Medicine

## 2010-05-17 ENCOUNTER — Ambulatory Visit: Payer: Self-pay | Admitting: Internal Medicine

## 2010-05-17 DIAGNOSIS — M2550 Pain in unspecified joint: Secondary | ICD-10-CM | POA: Insufficient documentation

## 2010-05-19 LAB — CONVERTED CEMR LAB
ANA Titer 1: 1:320 {titer} — ABNORMAL HIGH
Anti Nuclear Antibody(ANA): POSITIVE — AB
Rheumatoid fact SerPl-aCnc: 34 intl units/mL — ABNORMAL HIGH (ref 0–20)

## 2010-05-20 LAB — CONVERTED CEMR LAB
ALT: 19 units/L (ref 0–35)
AST: 19 units/L (ref 0–37)
Albumin: 3.8 g/dL (ref 3.5–5.2)
Alkaline Phosphatase: 56 units/L (ref 39–117)
BUN: 17 mg/dL (ref 6–23)
Basophils Absolute: 0.1 10*3/uL (ref 0.0–0.1)
Basophils Relative: 1.1 % (ref 0.0–3.0)
Bilirubin, Direct: 0.1 mg/dL (ref 0.0–0.3)
CO2: 33 meq/L — ABNORMAL HIGH (ref 19–32)
Calcium: 9.7 mg/dL (ref 8.4–10.5)
Chloride: 101 meq/L (ref 96–112)
Creatinine, Ser: 0.7 mg/dL (ref 0.4–1.2)
Eosinophils Absolute: 0.2 10*3/uL (ref 0.0–0.7)
Eosinophils Relative: 3.7 % (ref 0.0–5.0)
GFR calc non Af Amer: 91.55 mL/min (ref >60.00)
Glucose, Bld: 89 mg/dL (ref 70–99)
HCT: 41.1 % (ref 36.0–46.0)
Hemoglobin: 14.2 g/dL (ref 12.0–15.0)
Lymphocytes Relative: 20.8 % (ref 12.0–46.0)
Lymphs Abs: 1.3 10*3/uL (ref 0.7–4.0)
MCHC: 34.6 g/dL (ref 30.0–36.0)
MCV: 84.9 fL (ref 78.0–100.0)
Monocytes Absolute: 0.4 10*3/uL (ref 0.1–1.0)
Monocytes Relative: 6.6 % (ref 3.0–12.0)
Neutro Abs: 4.2 10*3/uL (ref 1.4–7.7)
Neutrophils Relative %: 67.8 % (ref 43.0–77.0)
Phosphorus: 3.5 mg/dL (ref 2.3–4.6)
Platelets: 284 10*3/uL (ref 150.0–400.0)
Potassium: 4.5 meq/L (ref 3.5–5.1)
RBC: 4.84 M/uL (ref 3.87–5.11)
RDW: 14.3 % (ref 11.5–14.6)
Sodium: 144 meq/L (ref 135–145)
Total Bilirubin: 0.7 mg/dL (ref 0.3–1.2)
Total Protein: 7.1 g/dL (ref 6.0–8.3)
WBC: 6.3 10*3/uL (ref 4.5–10.5)

## 2010-06-06 HISTORY — PX: CARPAL TUNNEL RELEASE: SHX101

## 2010-06-15 ENCOUNTER — Ambulatory Visit: Payer: Self-pay | Admitting: Specialist

## 2010-06-21 ENCOUNTER — Ambulatory Visit: Payer: Self-pay | Admitting: Specialist

## 2010-06-26 ENCOUNTER — Encounter: Payer: Self-pay | Admitting: Gynecology

## 2010-06-27 ENCOUNTER — Encounter: Payer: Self-pay | Admitting: Gynecology

## 2010-07-06 NOTE — Assessment & Plan Note (Signed)
Summary: SINUS SYMPTOMS/ 9:45   Vital Signs:  Patient profile:   65 year old female Weight:      192 pounds Temp:     97.7 degrees F oral Resp:     14 per minute BP sitting:   110 / 60  (left arm) Cuff size:   large  Vitals Entered By: Mervin Hack CMA Duncan Dull) (Oct 23, 2009 10:06 AM) CC: sinus   History of Present Illness: Having cough recent sinus problems while in [place] Plans flying trip and then cruise and wants to make sure she is okay  Went to urgent care in [place] about 2 weeks ago given antibiotic for sinus (may have been sulfa)  Cough has been persistent Occ productive of mucus but generally loose  No sore throat--just slightly irritated Nasal discharge and PND  No fever but did have some chills initially---not now  has held cozaar for a week due to dizziness doing fine off it  Allergies: No Known Drug Allergies  Past History:  Past medical, surgical, family and social histories (including risk factors) reviewed for relevance to current acute and chronic problems.  Past Medical History: Reviewed history from 02/16/2009 and no changes required. Allergic rhinitis Depression Diverticulosis, colon Hyperlipidemia Hypertension Ovarian cyst Urinary incontinence  Past Surgical History: Reviewed history from 09/19/2007 and no changes required. Cholecystectomy, 1974 Hysterectomy/ bladder tack 10/06 Tubal/ bladder tacking 1979 Left ankle fracture- screw 1983 Vaginal deliveries x 3 Left knee meniscus- torn- no surger 2000 Stress ECHO 04/97 Aden. Celine Ahr, negative EF 68% 02/08 ECHO- 1 diast. dys. fx. EF normal 02/08 4/09 Lap band gastroplasty  Family History: Reviewed history from 10/16/2006 and no changes required. Father: Died at age 60, bone cancer Mother: alive COPD Siblings: One brother, testicular cancer, one sister CAD-- some on mom's side Parkinson's-Pat. uncle No DM  Social History: Reviewed history from 01/06/2009 and no changes  required. 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   Head:  mild frontal tenderness but no maxillary tenderness Ears:  R ear normal and L ear normal.   Nose:  Moderate congestion Mouth:  no erythema and no exudates.   Neck:  supple, no masses, and no cervical lymphadenopathy.   Lungs:  normal respiratory effort, no intercostal retractions, no accessory muscle use, normal breath sounds, no crackles, and no wheezes.     Impression & Recommendations:  Problem # 1:  SINUSITIS - ACUTE-NOS (ICD-461.9) Assessment New  was treated but may not be cleared hard to tell continue supportive care  start augmentin if not clearing up  Her updated medication list for this problem includes:    Amoxicillin-pot Clavulanate 875-125 Mg Tabs (Amoxicillin-pot clavulanate) .Marland Kitchen... 1 tab by mouth two times a day after eating for sinus infection  Complete Medication List: 1)  Paroxetine Hcl 20 Mg Tabs (Paroxetine hcl) .... Take 1/2  by mouth daily 2)  Requip 1 Mg Tabs (Ropinirole hydrochloride) .... Take one by mouth  at bedtime 3)  Triamterene-hctz 37.5-25 Mg Tabs (Triamterene-hctz) .... Take one every  day 4)  Furosemide 20 Mg Tabs (Furosemide) .... Take 1 tablet by mouth once a day as needed 5)  Toviaz 4 Mg Xr24h-tab (Fesoterodine fumarate) .Marland Kitchen.. 1 by mouth daily for overactive bladder 6)  Meclizine Hcl 25 Mg Tabs (Meclizine hcl) .Marland Kitchen.. 1 tab by mouth three times a day as needed to prevent motion sickness 7)  Amoxicillin-pot Clavulanate 875-125 Mg Tabs (Amoxicillin-pot clavulanate) .Marland Kitchen.. 1 tab by mouth two times a day after eating for sinus  infection  Patient Instructions: 1)  Please start the antibiotic if you are not improving over the next few days 2)  Please keep your regular follow up Prescriptions: AMOXICILLIN-POT CLAVULANATE 875-125 MG TABS (AMOXICILLIN-POT CLAVULANATE) 1 tab by mouth two times a day after eating for sinus infection  #20 x 0   Entered and Authorized by:   Cindee Salt MD   Signed by:   Cindee Salt MD on 10/23/2009   Method used:   Electronically to        [facility]* (retail)       [address]       [place], [place]  [address]       Ph: [phone]       Fax: [phone]   RxID:   [id number] MECLIZINE HCL 25 MG TABS (MECLIZINE HCL) 1 tab by mouth three times a day as needed to prevent motion sickness  #60 x 1   Entered and Authorized by:   Cindee Salt MD   Signed by:   Cindee Salt MD on 10/23/2009   Method used:   Electronically to        [facility]* (retail)       [address]       [place], [place]  [address]       Ph: [phone]       Fax: [phone]   RxID:   [id number]   Current Allergies (reviewed today): No known allergies

## 2010-07-06 NOTE — Letter (Signed)
Summary: [facility]   Imported By: Maryln Gottron 03/08/2010 15:08:45  _____________________________________________________________________  External Attachment:    Type:   Image     Comment:   External Document  Appended Document: [facility] right carpal tunnel--injected and splint given right shoulder impingement--trying mobic

## 2010-07-06 NOTE — Assessment & Plan Note (Signed)
Summary: 2:15CPX/DLO   Vital Signs:  Patient profile:   65 year old female Weight:      208 pounds Temp:     98.3 degrees F oral Pulse rate:   72 / minute Pulse rhythm:   regular BP sitting:   108 / 68  (left arm) Cuff size:   large  Vitals Entered By: Mervin Hack CMA Duncan Dull) (June 17, 2009 2:20 PM) CC: adult physical   History of Present Illness: Doing fairly well No further respiratory illnesses  Taking homeopathic HCG preparation still has band in place as well No swallowing problems or other issues with this Following strict dietary recommendations   Still has occ problems initiating sleep Advil PM really helps --she uses this as needed   Allergies: No Known Drug Allergies  Past History:  Past medical, surgical, family and social histories (including risk factors) reviewed for relevance to current acute and chronic problems.  Past Medical History: Reviewed history from 02/16/2009 and no changes required. Allergic rhinitis Depression Diverticulosis, colon Hyperlipidemia Hypertension Ovarian cyst Urinary incontinence  Past Surgical History: Reviewed history from 09/19/2007 and no changes required. Cholecystectomy, 1974 Hysterectomy/ bladder tack 10/06 Tubal/ bladder tacking 1979 Left ankle fracture- screw 1983 Vaginal deliveries x 3 Left knee meniscus- torn- no surger 2000 Stress ECHO 04/97 Aden. Celine Ahr, negative EF 68% 02/08 ECHO- 1 diast. dys. fx. EF normal 02/08 4/09 Lap band gastroplasty  Family History: Reviewed history from 10/16/2006 and no changes required. Father: Died at age 62, bone cancer Mother: alive COPD Siblings: One brother, testicular cancer, one sister CAD-- some on mom's side Parkinson's-Pat. uncle No DM  Social History: Reviewed history from 01/06/2009 and no changes required. 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 Eyes:  Denies blurring, double vision, and vision loss-1 eye. ENT:  Denies decreased hearing and ringing in ears; teeth okay--regular with dentist. CV:  Complains of lightheadness; denies chest pain or discomfort, difficulty breathing at night, difficulty breathing while lying down, fainting, palpitations, and shortness of breath with exertion; occ mild orthostatic dizziness. Resp:  Denies cough and shortness of breath. GI:  Complains of indigestion; denies abdominal pain, bloody stools, change in bowel habits, dark tarry stools, nausea, and vomiting; heartburn with chocolate. GU:  Complains of incontinence and urinary frequency; denies dysuria; Tries to limit using the toviaz---didn't really seem to help that much discussed safe sex . MS:  Complains of joint pain; denies joint swelling; mild arthritic symptoms in fingers and toes no meds. Derm:  Denies lesion(s) and rash. Neuro:  Complains of numbness and tingling; denies headaches and weakness; hand symptoms due to carpal tunnel. Psych:  Denies anxiety and depression. Heme:  Denies abnormal bruising and enlarge lymph nodes. Allergy:  Denies seasonal allergies and sneezing; rhinorrhea--not clearly allergic.  Physical Exam  General:  alert and normal appearance.   Eyes:  pupils equal, pupils round, pupils reactive to light, and no optic disk abnormalities.   Ears:  R ear normal and L ear normal.   Mouth:  no erythema and no exudates.   Neck:  supple, no masses, no thyromegaly, no carotid bruits, and no cervical lymphadenopathy.   Breasts:  no masses, no abnormal thickening, no tenderness, and no adenopathy.  Lungs:  normal respiratory effort and normal breath sounds.   Heart:  normal rate, regular rhythm, no murmur, and no gallop.   Abdomen:   soft, non-tender, and no masses.   Msk:  no joint tenderness and no joint swelling.   Pulses:  1+ in feet Extremities:  no sig edema Neurologic:  alert & oriented X3, strength normal in all extremities, and gait normal.   Skin:  no rashes and no suspicious lesions.   Psych:  normally interactive, good eye contact, not anxious appearing, and not depressed appearing.     Impression & Recommendations:  Problem # 1:  PREVENTIVE HEALTH CARE (ICD-V70.0) Assessment Comment Only doing great continuing with proper eating and exercise will schedule mammo due for colon next year  Problem # 2:  HYPERTENSION (ICD-401.9) Assessment: Improved  If persistent orthostatic changes, will cut losartan in past  Her updated medication list for this problem includes:    Cozaar 100 Mg Tabs (Losartan potassium) .Marland Kitchen... Take one by mouth daily    Triamterene-hctz 37.5-25 Mg Tabs (Triamterene-hctz) .Marland Kitchen... Take one every  day    Furosemide 20 Mg Tabs (Furosemide) .Marland Kitchen... Take 1 tablet by mouth once a day as needed  BP today: 108/68 Prior BP: 126/72 (03/16/2009)  Labs Reviewed: K+: 4.5 (12/26/2008) Creat: : 0.9 (12/26/2008)   Chol: 205 (12/26/2008)   HDL: 37.60 (12/26/2008)   LDL: DEL (11/16/2007)   TG: 219.0 (12/26/2008)  Orders: TLB-Renal Function Panel (80069-RENAL) TLB-CBC Platelet - w/Differential (85025-CBCD) TLB-TSH (Thyroid Stimulating Hormone) (84443-TSH) Venipuncture ([address])  Problem # 3:  HYPERLIPIDEMIA (MILD) (ICD-272.4) Assessment: Unchanged  will recheck with the weight loss  Labs Reviewed: SGOT: 28 (12/26/2008)   SGPT: 21 (12/26/2008)   HDL:37.60 (12/26/2008), 40.7 (11/16/2007)  LDL:DEL (11/16/2007)  Chol:205 (12/26/2008), 205 (11/16/2007)  Trig:219.0 (12/26/2008), 129 (11/16/2007)  Orders: TLB-Lipid Panel (80061-LIPID) TLB-Hepatic/Liver Function Pnl (80076-HEPATIC)  Problem # 4:  DEPRESSION (ICD-311) Assessment: Unchanged doing well will continue the med for now but lower  dose  Her updated medication list for this problem includes:    Paroxetine Hcl 20 Mg Tabs (Paroxetine hcl) .Marland Kitchen... Take 1/2  by mouth daily  Complete Medication List: 1)  Paroxetine Hcl 20 Mg Tabs (Paroxetine hcl) .... Take 1/2  by mouth daily 2)  Cozaar 100 Mg Tabs (Losartan potassium) .... Take one by mouth daily 3)  Requip 1 Mg Tabs (Ropinirole hydrochloride) .... Take one by mouth  at bedtime 4)  Triamterene-hctz 37.5-25 Mg Tabs (Triamterene-hctz) .... Take one every  day 5)  Furosemide 20 Mg Tabs (Furosemide) .... Take 1 tablet by mouth once a day as needed 6)  Toviaz 4 Mg Xr24h-tab (Fesoterodine fumarate) .Marland Kitchen.. 1 by mouth daily for overactive bladder  Other Orders: TLB-A1C / Hgb A1C (Glycohemoglobin) (83036-A1C) Radiology Referral (Radiology)  Patient Instructions: 1)  Please schedule a follow-up appointment in 6 months .  2)  Schedule your mammogram.   Current Allergies (reviewed today): No known allergies

## 2010-07-06 NOTE — Letter (Signed)
Summary: Results Follow up Letter  [place] [facility]  [address] [place], [place] [address]   Phone: [phone]  Fax: [phone]    06/30/2009 MRN: [id number]  [facility] [address] [place], [place]  [address]  Dear Ms. Boehringer,  The following are the results of your recent test(s):  Test         Result    Pap Smear:        Normal _____  Not Normal _____ Comments: ______________________________________________________ Cholesterol: LDL(Bad cholesterol):         Your goal is less than:         HDL (Good cholesterol):       Your goal is more than: Comments:  ______________________________________________________ Mammogram:        Normal __X___  Not Normal _____ Comments: Repeat in 1 year  ___________________________________________________________________ Hemoccult:        Normal _____  Not normal _______ Comments:    _____________________________________________________________________ Other Tests:    We routinely do not discuss normal results over the telephone.  If you desire a copy of the results, or you have any questions about this information we can discuss them at your next office visit.   Sincerely,      Tillman Abide, MD

## 2010-07-06 NOTE — Progress Notes (Signed)
Summary: requip  Phone Note Refill Request Message from:  Fax from Pharmacy on March 22, 2010 2:04 PM  Refills Requested: Medication #1:  REQUIP 1 MG TABS Take one by mouth  at bedtime   Last Refilled: 02/13/2010 Refill request from [facility]. [phone]. Form is on your desk.   Initial call taken by: Melody Comas,  March 22, 2010 2:05 PM  Follow-up for Phone Call        Rx completed in Dr. Tiajuana Amass Follow-up by: Cindee Salt MD,  March 22, 2010 5:32 PM    New/Updated Medications: REQUIP 1 MG TABS (ROPINIROLE HYDROCHLORIDE) Take one by mouth  at bedtime Prescriptions: REQUIP 1 MG TABS (ROPINIROLE HYDROCHLORIDE) Take one by mouth  at bedtime  #30 x 11   Entered and Authorized by:   Cindee Salt MD   Signed by:   Cindee Salt MD on 03/22/2010   Method used:   Electronically to        [facility]* (retail)       [address]       [place], [place]  [address]       Ph: [phone]       Fax: [phone]   RxID:   [id number]

## 2010-07-08 NOTE — Assessment & Plan Note (Signed)
Summary: 6 MONTH FOLLOW UP/RBH   Vital Signs:  Patient profile:   65 year old female Weight:      226 pounds BMI:     41.82 Temp:     98.2 degrees F oral Pulse rate:   68 / minute Pulse rhythm:   regular BP sitting:   140 / 80  (left arm) Cuff size:   large  Vitals Entered By: Mervin Hack CMA Duncan Dull) (May 17, 2010 12:29 PM) CC: 6 month follow-up   History of Present Illness: Has had a lot of emotional issues This affected her eating and she put a lot of weight back on Was betrayed by a close friend Cat is sick---companion for 15 years  SHe had never cut the paroxetine down--surprised by refill with lower dose  Having more hand pain--and also in feet worried about RA---son has this takes advil 8-10 tabs a day---- does "take the edge off" Not sleeping well Not really stiff in morning if she takes advil at night  Just restarted back on the weight control program new job now working longer hours --  3-9PM. This is affecting schedule  No chest pain No SOB  Has been holding off on toviaz still with some incontinence so wants to try it again  Allergies: No Known Drug Allergies  Past History:  Past medical, surgical, family and social histories (including risk factors) reviewed for relevance to current acute and chronic problems.  Past Medical History: Reviewed history from 02/16/2009 and no changes required. Allergic rhinitis Depression Diverticulosis, colon Hyperlipidemia Hypertension Ovarian cyst Urinary incontinence  Past Surgical History: Reviewed history from 09/19/2007 and no changes required. Cholecystectomy, 1974 Hysterectomy/ bladder tack 10/06 Tubal/ bladder tacking 1979 Left ankle fracture- screw 1983 Vaginal deliveries x 3 Left knee meniscus- torn- no surger 2000 Stress ECHO 04/97 Aden. Celine Ahr, negative EF 68% 02/08 ECHO- 1 diast. dys. fx. EF normal 02/08 4/09 Lap band gastroplasty  Family History: Reviewed history from 10/16/2006  and no changes required. Father: Died at age 39, bone cancer Mother: alive COPD Siblings: One brother, testicular cancer, one sister CAD-- some on mom's side Parkinson's-Pat. uncle No DM  Social History: Reviewed history from 01/06/2009 and no changes required. 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 Uses melatonin very stiff after getting up after sitting a while Takes natural muscle relaxer and magnesium No set exercise  Physical Exam  General:  alert and normal appearance.   Neck:  supple, no masses, no thyromegaly, no carotid bruits, and no cervical lymphadenopathy.   Lungs:  normal respiratory effort, no intercostal retractions, no accessory muscle use, and normal breath sounds.   Heart:  normal rate, regular rhythm, no murmur, and no gallop.   Msk:  no sig hand synoviitis mild swelling in ankles---could be edema or in joints Extremities:  trace edema at ankles Psych:  normally interactive, good eye contact, not anxious appearing, and dysphoric affect.     Impression & Recommendations:  Problem # 1:  DEPRESSION (ICD-311) Assessment Deteriorated lots of stress needs the higher dose--had never really cut down  Her updated medication list for this problem includes:    Paroxetine Hcl 20 Mg Tabs (Paroxetine hcl) .Marland Kitchen... Take  1  by mouth daily  Problem # 2:  HYPERTENSION (ICD-401.9) Assessment: Unchanged  good control on meds  Her updated medication list for this problem includes:    Triamterene-hctz 37.5-25 Mg Tabs (Triamterene-hctz) .Marland Kitchen... Take one every  day    Furosemide 20 Mg Tabs (Furosemide) .Marland Kitchen... Take 1 tablet by mouth once a day as  needed  BP today: 140/80 Prior BP: 110/60 (10/23/2009)  Labs Reviewed: K+: 4.9 (06/17/2009) Creat: : 1.3 (06/17/2009)   Chol: 231 (06/17/2009)   HDL: 40.30 (06/17/2009)   LDL: DEL (11/16/2007)   TG: 135.0 (06/17/2009)  Orders: TLB-Renal Function Panel (80069-RENAL) TLB-CBC Platelet - w/Differential (85025-CBCD) TLB-Hepatic/Liver Function Pnl (80076-HEPATIC) TLB-TSH (Thyroid Stimulating Hormone) (84443-TSH) Venipuncture (16[address])  Problem # 3:  PAIN IN JOINT, MULTIPLE SITES (ICD-719.49) Assessment: Deteriorated  probably underuse and osteoarthritis given son's history, will check RF and ANA using the advil needs to increase exercise again  Orders: Specimen Handling ([address]) T-Antinuclear Antib (ANA) [id number]) T-Rheumatoid Factor (29562-13086)  Problem # 4:  URINARY INCONTINENCE (ICD-788.30) Assessment: Deteriorated wants to restart the toviaz  Problem # 5:  OBESITY, MORBID (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 Take 1  by mouth daily 2)  Requip 1 Mg Tabs (Ropinirole hydrochloride) .... Take one by mouth  at bedtime 3)  Triamterene-hctz 37.5-25 Mg Tabs (Triamterene-hctz) .... Take one every  day 4)  Furosemide 20 Mg Tabs (Furosemide) .... Take 1 tablet by mouth once a day as needed 5)  Toviaz 4 Mg Xr24h-tab (Fesoterodine fumarate) .Marland Kitchen.. 1 by mouth daily for overactive bladder  Patient Instructions: 1)  Please schedule a follow-up appointment in 3 months .  Prescriptions: TOVIAZ 4 MG XR24H-TAB (FESOTERODINE FUMARATE) 1 by mouth daily for overactive bladder  #90 x 3   Entered and Authorized by:   Cindee Salt MD   Signed by:   Cindee Salt MD on 05/17/2010   Method used:   Electronically to        [facility]* (retail)       [address]       [place], [place]  [address]       Ph: [phone]       Fax: [phone]   RxID:    [id number] PAROXETINE HCL 20 MG TABS (PAROXETINE HCL) Take 1  by mouth daily  #90 x 3   Entered and Authorized by:   Cindee Salt MD   Signed by:   Cindee Salt MD on 05/17/2010   Method used:   Electronically to        [facility]* (retail)       [address]       [place], [place]  [address]       Ph: [phone]       Fax: [phone]   RxID:   [id number]    Orders Added: 1)  Est. Patient Level IV [[address]] 2)  Specimen Handling [99000] 3)  T-Antinuclear Antib (ANA) [76283-15176] 4)  T-Rheumatoid Factor [16073-71062] 5)  TLB-Renal Function Panel [80069-RENAL] 6)  TLB-CBC Platelet - w/Differential [85025-CBCD] 7)  TLB-Hepatic/Liver Function Pnl [80076-HEPATIC] 8)  TLB-TSH (Thyroid Stimulating Hormone) [84443-TSH] 9)  Venipuncture [69485]    Current Allergies (reviewed today): No known allergies

## 2010-08-16 ENCOUNTER — Ambulatory Visit: Payer: Self-pay | Admitting: Internal Medicine

## 2010-09-01 ENCOUNTER — Other Ambulatory Visit: Payer: Self-pay | Admitting: *Deleted

## 2010-09-01 NOTE — Telephone Encounter (Signed)
Okay to refill x 1 year Can be electronic

## 2010-09-02 MED ORDER — PAROXETINE HCL 20 MG PO TABS: ORAL_TABLET | ORAL | Status: DC

## 2010-09-02 NOTE — Telephone Encounter (Signed)
rx sent to pharmacy, e-script

## 2010-09-07 ENCOUNTER — Ambulatory Visit: Payer: Self-pay | Admitting: Internal Medicine

## 2010-10-01 ENCOUNTER — Encounter: Payer: Self-pay | Admitting: Internal Medicine

## 2010-10-04 ENCOUNTER — Ambulatory Visit: Payer: Self-pay | Admitting: Internal Medicine

## 2010-10-19 NOTE — Assessment & Plan Note (Signed)
[facility] OFFICE NOTE   Katrina, Andrews                       MRN:          [id number]  DATE:01/08/2007                            DOB:          07-Jan-1946    PRIMARY CARE PHYSICIAN:  Dr. Alphonsus Andrews.   INTERVAL HISTORY:  [name] is a delightful 65 year old woman with a  history of obesity, hypertension, sleep apnea who returns today for  routine followup.   Overall she is doing well.  We had started her on some p.r.n. Lasix for  some lower extremity edema.  She is taking this 2 or 3 times a day with  good result.  She does note occasional cramping.  She is not taking  potassium.  She says her breathing is much better.  She denies any chest  pain.  She has been following her blood pressure at various doctor's  offices.  Her systolics have been running in the 115-125 range.   She continues to have some difficulty with her separation from her  husband and is trying to take more time for herself.   CURRENT MEDICATIONS:  1. Paxil 20 a day.  2. ReQuip 1 mg q.h.s.  3. Sanctura.  4. Ambien CR 12.5 a day at night.  5. Aspirin 81.  6. Vitamin B6.  7. Triamterene HCTZ 37.5/25.  8. Cozaar 100 a day.  9. Multivitamin.   PHYSICAL EXAM:  She is well-appearing, no acute distress.  Ambulates  around the clinic without any respiratory difficulty.  Blood pressure is  124/76, heart rate 72, weight is 249.  HEENT:  Normal.  NECK:  Supple, no JVD.  Carotids are 2+ bilaterally without any bruits.  There is no lymphadenopathy or thyromegaly.  CARDIAC:  PMI is nonpalpable.  She has a regular rate and rhythm with an  S4, no murmur.  LUNGS:  Clear.  ABDOMEN:  Obese, nontender, nondistended.  No bruits or masses.  Unable  to appreciate any hepatosplenomegaly.  Good bowel sounds.  EXTREMITIES:  Warm with no cyanosis, clubbing or edema.  NEURO:  Alert and oriented x3.  Cranial nerves II-XII are intact.  Moves  all 4  extremities without difficulty.  Affect is normal.   ASSESSMENT AND PLAN:  1. Hypertension, well controlled.  Continue current therapy.  2. Dyspnea.  This is resolved.  Stress test and echocardiogram looked      good.   DISPOSITION:  1. She will return to clinic in 6 months for routine followup.  2. We will also check a BMET today to make sure her potassium is      stable.  I did tell her to take a potassium every time she takes a      Lasix dose.     Katrina Buckles. Bensimhon, MD  Electronically Signed    DRB/MedQ  DD: 01/08/2007  DT: 01/08/2007  Job #: [id number]   cc:   Katrina Schwalbe, MD

## 2010-10-19 NOTE — Assessment & Plan Note (Signed)
[facility] OFFICE NOTE   Katrina Andrews, Katrina Andrews                       MRN:          [id number]  DATE:10/23/2006                            DOB:          1945/10/19    Cardiology Clinic Note from [facility]   INTERVAL HISTORY:  Ms. Linden is a delightful 65 year old woman who is a  friend of Jimmy Footman.  She has a history of morbid obesity,  hypertension, sleep apnea, who returns today for routine followup.  She  just had a normal echocardiogram and with grade I diastolic dysfunction  and normal nuclear study with an EF of 68% with no scar or ischemia.   Overall, she says she is doing fairly well.  Her dyspnea is somewhat  improved but not resolved.  She does have some mild lower extremity  edema which she attributes to dietary non-compliance.  She is taking the  Requip but unfortunately has not followed up with Dr. Shelle Iron for her  CPAP.   Her main complaint today is that of emotional stress that she is going  through a separation with her previous husband of 37 years.   CURRENT MEDICATIONS:  1. Phentermine 30 mg every other day.  2. Requip 1 mg nightly.  3. Sanctura 120 mg nightly.  4. Cozaar 100 mg daily.  5. Triamterene/hydrochlorothiazide 37.5/25.  6. Aspirin 81.  7. Fish oil.   PHYSICAL EXAMINATION:  She ambulates around the clinic without any  respiratory difficulty.  Blood pressure is 132/78 with a heart rate of 82.  Weight is 256, which  is up 6 pounds.  HEENT:  Is normal.  NECK:  Is supple.  Thick.  Difficult to evaluate JVD but appears flat.  Carotids are 2+ bilaterally without any bruits.  There is no  lymphadenopathy or thyromegaly.  CARDIAC:  Shows a regular rate and rhythm with a soft S4, soft 2/6  systolic ejection murmur at the left sternal border.  LUNGS:  Are clear  ABDOMEN:  Is obese, nontender, nondistended.  No hepatosplenomegaly, no  bruits, no masses appreciated.  Good bowel  sounds.  EXTREMITIES:  Are warm with no cyanosis or clubbing.  There is trace to  1+ edema bilaterally.  No rashes.  NEURO:  Alert and oriented x3.  Cranial nerves II through XII are  intact.  Affect is normal.  Moves all 4 extremities without difficulty.   ASSESSMENT AND PLAN:  1. Hypertension.  Blood pressure is still mildly elevated.  I did      suggest the possibility of adding Norvasc 2.5 but she is concerned      that this will worsen her edema.  She would like to hold off at      this point.  I did tell her if her blood pressure remains elevated      at followup, then we need to reconsider this.  2. Mild lower extremity edema.  We gave her a prescription for Lasix      20 mg p.r.n. to take also with potassium 20 mEq p.r.n.  3. Obstructive sleep apnea.  I  have asked her to followup with Dr.      Shelle Iron to hopefully be initiated on CPAP.     Bevelyn Buckles. Bensimhon, MD  Electronically Signed    DRB/MedQ  DD: 10/23/2006  DT: 10/23/2006  Job #: [id number]

## 2010-10-19 NOTE — Op Note (Signed)
NAMEMARGARINE, Katrina Andrews              ACCOUNT NO.:  [id number]   MEDICAL RECORD NO.:  [id number]          PATIENT TYPE:  OIB   LOCATION:  0098                         FACILITY:  [facility]   PHYSICIAN:  Thornton Park. Daphine Deutscher, MD  DATE OF BIRTH:  1946/01/03   DATE OF PROCEDURE:  DATE OF DISCHARGE:                               OPERATIVE REPORT   PREOPERATIVE DIAGNOSES:  1. Morbid obesity, body mass index 47.  2. High blood pressure.  3. Sleep apnea.   POSTOPERATIVE DIAGNOSES:  1. Morbid obesity, body mass index 47.  2. High blood pressure.  3. Sleep apnea.   PROCEDURE:  Laparoscopic adjustable gastric banding (Allergan APL  system) with closure of the esophageal hiatus posteriorly using  Endostitch.   SURGEON:  Dr. Luretha Murphy.   ASSISTANT:  Dr. Ovidio Kin.   ANESTHESIA:  General endotracheal.   DESCRIPTION OF PROCEDURE:  Katrina Andrews was taken to room 1 on September 11, 2007  and given general anesthesia.  The abdomen was prepped with Techni-Care  and draped sterilely. He entered the abdomen through the left upper  quadrant using an OptiVu without difficulty and insufflated.  She had  multiple adhesions in the midline from her previous midline  cholecystectomy.  The second trocar was placed to the left the umbilicus  and through that introduced scissors and took down these adhesions.  Standard ports were placed in the upper abdomen and the somewhat plump  left lateral segment was retracted.  She had some adhesions up there and  I went ahead and freed those up.  She had a lot of proximal foregut  adiposity.  Upper GI had shown a small sliding hiatal hernia and she did  admit to some symptoms of reflux.  Therefore I went ahead and went  posteriorly and identified the right and left crus and it looked like  she might have some fatty infiltration in her distal esophagus but I was  unable to identify a discrete lipoma.  Once this was done, I went ahead  and put a single Endostitch  approximating the posterior leaves of the  crura right and left tying this together and placing a tie knot on them.   I then plucked the band passer from a little farther down where the fat  striped across the right crus and the came up over by the left crus.  The APL band had been pulled as I had previously done the sizing with  the balloon which did not necessarily go up into the esophagus with 15  mL but did with 10.  The band was then passed around and snapped in  place and plicated with three sutures, again securing with tie knots.  I  put Tisseel over the completed band plication, brought the tubing into  the lower port on the right attaching it to a port.  This was sutured to  the  anterior fascia with interrupted sutures of #0 Prolene.  The wounds were  all irrigated and were closed with 4-0 Vicryl subcutaneously and  subcuticularly with Dermabond.  The patient was taken to the recovery  room  in satisfactory condition.      Thornton Park Daphine Deutscher, MD  Electronically Signed     MBM/MEDQ  D:  09/11/2007  T:  09/11/2007  Job:  [id number]   cc:   Karie Schwalbe, MD  [address] [place], [place] [address]   Bevelyn Buckles. Bensimhon, MD  [address] [place] [address]

## 2010-10-19 NOTE — Assessment & Plan Note (Signed)
[facility] OFFICE NOTE   KEARIE, Katrina Andrews                       MRN:          [id number]  DATE:07/18/2007                            DOB:          12-05-1945    PRIMARY CARE PHYSICIAN:  Katrina Andrews Schwalbe, MD   INTERVAL HISTORY:  Ms. Caulder is a delightful 64 year old woman with a  history of obesity, hypertension, sleep apnea and previous dyspnea.  She  had a Myoview and an echocardiogram back in February 2008, both of which  were normal.  She returns today for routine follow-up.   She says she is doing very well.  She continues to exercise quite  vigorously 3 days a week without any problems.  Her blood pressure has  been under good control.  She has not had any further problem with  dyspnea.  She has made the decision to proceed with bariatric surgery  and is scheduled for the lap band later this month.   CURRENT MEDICATIONS:  1. Paxil 20 mg a day.  2. Requip 1 mg at night.  3. Aspirin 81.  4. Multivitamin,.  5. Triamterene/HCTZ 37.5/25.  6. Cozaar 100 a day.  7. Calcium plus vitamin D.  8. Vesicare 10 mg a day.   PHYSICAL EXAMINATION:  GENERAL:  She is well-appearing, no acute  distress, ambulates around the clinic without respiratory difficulty.  VITAL SIGNS:  Blood pressure is 106/76, heart rate 78, weight is 259.  HEENT:  Normal.  NECK:  Supple.  No JVD.  Carotid 2+ bilateral bruits.  There is no  lymphadenopathy or thyromegaly.  CARDIAC:  PMI is not palpable.  She is regular rate and rhythm, S4, no  murmur.  LUNGS:  Clear.  ABDOMEN:  Obese, nontender, nondistended, no bruits, no masses.  No  obvious hepatosplenomegaly.  Good bowel sounds.  EXTREMITIES:  Warm with no sinus clubbing or edema.  No rash.  NEUROLOGICAL:  Alert, oriented x3.  Cranial nerves II-XII intact.  Moves  all four extremities without difficulty.  Affect is normal.   STUDIES:  EKG shows normal sinus rhythm at a rate of 78.   No ST-T wave  abnormalities.  All the intervals are normal.   ASSESSMENT/PLAN:  1. Hypertension, well controlled.  Continue current therapy.  2. Dyspnea.  This is resolved.  Stress test and echocardiogram looked      good.  3. Preoperative surgical clearance.  She is low risk for any      perioperative cardiac complications and would not recommend any      further cardiac workup prior to her surgery.  If you have any      questions, please do not hesitate to call me.     Bevelyn Buckles. Bensimhon, MD  Electronically Signed    DRB/MedQ  DD: 07/18/2007  DT: 07/19/2007  Job #: [id number]   cc:   Katrina Andrews Schwalbe, MD  Thornton Park. Daphine Deutscher, MD

## 2010-10-22 NOTE — H&P (Signed)
NAMESURAH, PELLEY              ACCOUNT NO.:  [id number]   MEDICAL RECORD NO.:  [id number]          PATIENT TYPE:  AMB   LOCATION:  SDC                           FACILITY:  [facility]   PHYSICIAN:  Ginger Carne, MD  DATE OF BIRTH:  [birth year]   DATE OF ADMISSION:  03/18/2005  DATE OF DISCHARGE:                                HISTORY & PHYSICAL   ADMISSION DIAGNOSIS:  Left lower quadrant pain, persistent left ovarian cyst  and genuine urinary stress incontinence.   HISTORY OF PRESENT ILLNESS:  This patient is a 65 year old gravida 3, para  3, 0, 0, 3, Caucasian female admitted for laparoscopic assisted vaginal  hysterectomy and bilateral salpingo-oophorectomy. Tension free vaginal tape  procedure and cystoscopy because of the history of a 1 year persistent left  ovarian cystic mass with left lower quadrant pain and genuine urinary stress  incontinence. The patient had moved from [place] and was followed by a  physician for said left ovarian cystic mass which measured 3 to 4 cm. This  has persisted and is approximately 4 to 5 cm. CA-125 if normal. The patient  has had intermittent pain in the left lower quadrant, however it is  uncertain whether if this can be distinguishable from a history of irritable  bowel syndrome. The patient is not comfortable with leaving said mass in  although she understands the risk of neoplasia is extremely low. The patient  has no genitourinary or musculoskeletal sources for her discomfort.   The patient loses urine with cough, straining and other valsalva maneuvers.  She is required to wear a pad continuously for hygiene. She does not lose  urine at rest. No symptoms of an overactive bladder and denies nocturia,  straining to void, or post void dribbling. She does not have fecal  incontinence. She has had a Burch colposuspension in 1979.   OB/GYN HISTORY:  The patient has had three vaginal deliveries between 1973  and 1978. She had a tubal ligation in  1979. Menopause at age 49. No history  of post menopausal bleeding.   PAST MEDICAL HISTORY:  1.  The patient has been treated for situational depression.  2.  History of hypertension.  3.  Irritable bowel syndrome.   PAST SURGICAL HISTORY:  The patient had repair of a fractured ankle in 1983.  She had a cholecystectomy in 1974.  Bilateral tubal ligation in 1979 at which time a Burch colposuspension was  performed. In 2000 she had a left torn knee meniscus which was managed  conservatively without surgery.   ALLERGIES:  None.   MEDICATIONS:  1.  Lisinopril 20 mg daily.  2.  Triamterene & hydrochlorothiazide 37.5/25.  3.  Paxil 20 mg daily.  4.  Chondroitin, vitamin B complex, flax seed.  5.  Baby aspirin one daily.  6.  Claritin p.r.n. daily.   FAMILY HISTORY:  Positive for her mother having carcinoma of the breast. She  otherwise has no first degree relatives with breast, colon, ovarian or  uterine carcinoma.   SOCIAL HISTORY:  The patient stopped smoking 20 years ago. Does not abuse  alcohol or illicit drugs.   REVIEW OF SYSTEMS:  Negative.   PHYSICAL EXAMINATION:  GENERAL:  A pleasant female in no acute distress.  Height 5 feet, 3 inches, weight 246 pounds.  VITAL SIGNS:  Blood pressure 134/80.  HEENT:  Grossly normal.  BREAST EXAM:  Without mass or discharge, thickenings or tenderness.  CHEST:  Clear to percussion and auscultation.  CARDIOVASCULAR:  Without murmurs or enlargement. Regular rate and rhythm.  EXTREMITIES/LYMPHATICS/SKIN, NEUROLOGICAL/MUSCULOSKELETAL SYSTEMS:  Within  normal limits.  ABDOMEN:  Soft without splenomegaly.  PELVIC EXAM:  Normal Pap smear. External genitalia, vulva and vagina normal.  Cervix smoothe without erosions or lesions. Uterus is normal in size. Right  and left adnexa difficult to palpate due to patient's obesity.  RECTAL EXAM:  Hemoccult negative without masses.   Transvaginal ultrasound confirms a 4 cm left ovarian simple cyst.  Remainder  of pelvis unremarkable.   Filling cystometry reveals no evidence for spontaneous detrusor  contractions. Urinalysis is negative. Residual urine volume is 16 cc.   IMPRESSION:  1.  Persistent left ovarian simple cystic mass.  2.  Genuine urinary stress incontinence.   PLAN:  The patient is not comfortable with having persistent mass remain in  left ovary. She understands the risk of neoplasia is low but not zero. The  patient will undergo a laparoscopic assisted vaginal hysterectomy and  bilateral salpingo-oophorectomy. Ashby Dawes of said procedure discussed in  detail. Risks including injury to ureter, bowel or bladder, possible  conversion to an open procedure. Hemorrhage possibly requiring a blood  transfusion, infection, pulmonary complications and other unforeseen  complications discussed and understood by said patient. Ashby Dawes of a tension  free vaginal tape procedure also discussed. Risks including postoperative  urinary retention, urgency, infection, graft rejection, erosion, and  bleeding were discussed and understood.      Ginger Carne, MD  Electronically Signed     SHB/MEDQ  D:  03/15/2005  T:  03/15/2005  Job:  [id number]

## 2010-10-22 NOTE — Assessment & Plan Note (Signed)
[facility]                               PULMONARY OFFICE NOTE   Katrina Andrews, Katrina Andrews                       MRN:          [id number]  DATE:01/10/2006                            DOB:          August 19, 1945    HISTORY OF PRESENT ILLNESS:  The patient is a 65 year old female who I have  been asked to see for sleeping difficulties. The patient states that she  typically tried to get to bed between 10 and 12 and gets up at 8 a.m. to  start her day. She is tired in the morning whenever she arises. She has  difficulty with sleep onset and also sleep maintenance. She can typically  get to sleep between 10 and 20 minutes. At other times, it may be 1 to 2  hours. She has very frequent awakening during the night of unknown reasons.  She states that I hate going to bed. The patient has sleep pressure with  periods of inactivity during the day. She will doze with TV or movies in the  late afternoon. She has no difficulty with driving. Of note, she has  admitted to having snoring and admits to snoring arousal but no one has ever  mentioned pauses in her breathing during sleep. Also of note, the patient's  weight is up 20 pounds over the last 2 years.   PAST MEDICAL HISTORY:  1. Hypertension.  2. Allergic rhinitis.  3. Status post cholecystectomy, hysterectomy, and tubal ligation.  4. History of orthopedic procedures.   MEDICATIONS:  Include Lisinopril 20 mg daily, Paxil 20 mg daily,  hydrochlorothiazide 25 mg daily p.r.n.   ALLERGIES:  NO KNOWN DRUG ALLERGIES.   SOCIAL HISTORY:  She is currently separated and has children. There is a  history of smoking 1 pack per day or less for 20 to 25 years. She has not  smoked since 1986.   FAMILY HISTORY:  Remarkable for her mother having emphysema, heart disease,  and strokes. Her father has had bone cancer.   REVIEW OF SYSTEMS:  As per history of present illness. Also see patient  intake form documented on the  chart.   PHYSICAL EXAMINATION:  GENERAL:  She is a morbidly obese female in no acute  distress.  VITAL SIGNS:  Blood pressure 144/82, pulse 75, temperature 98.3, weight 256  pounds. O2 saturation on room air is 96%.  HEENT:  Pupils are equal, round, and reactive to light and accommodation.  Extraocular muscles intact. Nares are very narrowed. Oropharynx shows mild  elongation of the soft palate and uvula.  NECK:  Supple without JVD or lymphadenopathy. There is no palpable  thyromegaly.  CHEST:  Totally clear.  CARDIAC:  Regular rate and rhythm. No murmur, rub, or gallop.  ABDOMEN:  Soft, nontender, with good bowel sounds.  GENITAL/RECTAL/BREAST:  Examinations not done and not indicated.  EXTREMITIES:  Lower extremities with trace edema. Pulses intact distally.  NEUROLOGIC:  She is alert and oriented with no obvious observable motor  defects.   IMPRESSION:  Questionable obstructive sleep apnea. The patient certainly has  physical findings and  history that could be consistent with this sleep  disorder. This could certainly contribute to her frequent awakenings during  the night and help escalate her frustration about trying to go to sleep and  maintain sleep. I also think that she has an element of psychophysiologic  insomnia because of her frustration in getting adequate sleep. At this point  in time, I think it would be worthwhile to do a nocturnal polysomnography.  The patient is agreeable to the approach.   PLAN:  1. Schedule for NT SG.  2. Work on weight loss.  3. She will followup after the above.                                   Barbaraann Share, MD, FCCP   KMC/MedQ  DD:  01/14/2006  DT:  01/14/2006  Job #:  [id number]   cc:   Karie Schwalbe, MD

## 2010-10-22 NOTE — Procedures (Signed)
[name], Katrina Andrews              ACCOUNT NO.:  [id number]   MEDICAL RECORD NO.:  [id number]          PATIENT TYPE:  OUT   LOCATION:  SLEEP CENTER                 [facility]ACILITY[facility]  [facility]   PHYSICIAN:  Barbaraann Share, MD,FCCPDATE OF BIRTH:  03/05/1946   DATE OF STUDY:  02/14/2006                              NOCTURNAL POLYSOMNOGRAM   REFERRING PHYSICIAN:  Dr. Marcelyn Bruins   INDICATION FOR THE STUDY:  Hypersomnia with sleep apnea.   EPWORTH SCORE:  7   SLEEP ARCHITECTURE:  The patient had a total sleep time of 340 minutes with  very little slow wave sleep and never achieved REM.  Sleep onset latency was  prolonged at 38 minutes.  Sleep efficiency was decreased at 81%.   RESPIRATORY DATA:  The patient was found to have 47 hypopneas and no apneas  for a respiratory disturbance index of 8 events per hour.  The events were  not positional but there was moderate to loud snoring noted throughout.   OXYGEN DATA:  There was O2 desaturation as low as 87% with the patient's  obstructive events.   CARDIAC DATA:  There was no clinically significant cardiac arrhythmias.   MOVEMENT/PARASOMNIA:  The patient was found have 113 leg jerks with 11 per  hour resulting in arousal or awakening.   IMPRESSION/RECOMMENDATIONS:  1. Very mild obstructive sleep apnea/hypopnea syndrome with a respiratory      disturbance index of eight events per hour and oxygen desaturation as      low as 87%.  Treatment for this degree of sleep apnea should focus on      weight loss alone if applicable, upper airway surgery, oral appliance,      and also CPAP.  2. Very large numbers of leg jerks with significant sleep disruption.      This may be the dominant issue with respect to the patient's sleep      disturbance.  Clinical correlation is suggested.           ______________________________  Barbaraann Share, MD,FCCP  Diplomate, American Board of Sleep  Medicine     KMC/MEDQ  D:  02/16/2006 15:56:13  T:  02/17/2006  13:19:50  Job:  [id number]

## 2010-10-22 NOTE — Assessment & Plan Note (Signed)
[facility]                            CARDIOLOGY OFFICE NOTE   BRIXTON, SCHNAPP                       MRN:          [id number]  DATE:07/25/2006                            DOB:          Nov 03, 1945    REFERRING PHYSICIAN:  Dorian Pod, ACNP   PRIMARY CARE PHYSICIAN:  Dr. Karie Schwalbe   PATIENT IDENTIFICATION:  Ms. Evola is a delightful 65 year old woman with  a history of morbid obesity, hypertension, who presents today for  further evaluation of dyspnea and chronic cough.   HISTORY OF PRESENT ILLNESS:  Ms. Giacobbe denies any history of known  coronary artery disease. She has not had a stress test or  catheterization.  Over the past few months she has been having a very  heard time with chronic dry cough.  At [facility] suggestion she  stopped her lisinopril and the cost has gotten much better.  She still  has some postnasal drip and congestion with an associated residual  cough, but it is much improved.  She also notes that she has dyspnea on  exertion, mostly with going up steps.  This is relatively stable, but  does seem to flare at times.  She has occasional lower extremity edema  which she manages with her diuretic. She has not had any chest pain or  pressure.  She does have a history of morbid obesity and was on Phen-fen  in the past for about 4 months, but now discontinued this and is just on  phentermine.  She also has a history of obstructive sleep apnea and  restless leg syndrome for which she has seen Dr. Shelle Iron.  She has not  been wearing her CPAP as she has had some problems with it, but she is  planning on going back to see Dr. Shelle Iron.   REVIEW OF SYSTEMS:  Is notable for occasional headaches, some dizziness  and light headedness but no syncope.  Also irritable bowel syndrome and  depression as well as urinary problems.  She denies any bright red blood  per rectum, no melena, no fevers, chills.  The remainder of review  of  systems is negative except for HPI and problem list.   PROBLEM LIST:  1. Hypertension with cough on ACE-inhibitor.  2. Morbid obesity previously on Phen-fen x4 months.  3. Depression.  4. Restless leg syndrome.  5. Obstructive sleep apnea.  6. Arthritis.   CURRENT MEDICATIONS:  1. Paxil 20 daily.  2. Phentermine 30 daily.  3. Requip.  4. Sanctura XR.  5. Fish oil.  6. Vitamins.  7. Aspirin 81.  8. Triamterene/hydrochlorothiazide 37.5/25.   ALLERGIES:  No known drug allergies.   SOCIAL HISTORY:  She is separated, she has 3 children.  She is a retired  Lawyer.  She has a history of tobacco 1 pack per day x20  years.  Quit in 1987.  She does not drink alcohol.   FAMILY HISTORY:  Mother died at 57 due to chronic obstructive pulmonary  disease and congestive heart failure.  Father died at 29 due to bone  cancer.  She has a sister and brother who are alive and well.   PHYSICAL EXAMINATION:  She is a very pleasant woman in no acute  distress. Ambulates around the clinic without any respiratory  difficulty.  Blood pressure is 146/90, heart rate is 82, weight is 250 pounds.  HEENT:  Sclerae anicteric.  EOMI, there is no xanthelasma.  Mucous  membranes are moist.  Oropharynx is clear.  NECK:  Is supple.  There is no JVD. Carotids are 2+ bilaterally without  any bruits.  There is no lymphadenopathy, no thyromegaly.  CARDIAC:  Shows a regular rate and rhythm with 2/6 systolic ejection  murmur at the left sternal border.  There is no rub or gallop.  LUNGS:  Are clear.  ABDOMEN:  Obese, nontender, nondistended.  No obvious  hepatosplenomegaly.  No bruits.  No masses, good bowel sounds.  EXTREMITIES:  Are warm without no cyanosis or clubbing.  There is trace  edema.  Good pulses. No rashes.  NEUROLOGIC:  She is alert and oriented x3.  Cranial nerves II through  XII are intact.  She moves all 4 extremities without difficulty.  Affect  is delightful.   EKG shows  normal sinus rhythm with a rate of 82 with normal axis and  intervals.  No significant ST-T wave abnormalities.   ASSESSMENT:  1. Dyspnea on exertion.  I suspect this is mostly related to her      weight but I do think it would be reasonable to pursue an      echocardiogram to make sure and evaluate for diastolic dysfunction      or any possible underlying pulmonary hypertension.  Given the fact      that she is going to also start and exercise program, I also think      it is reasonable to proceed with a screening Myoview  for further      evaluation.  2. Obesity.  We did have a long talk about the need for getting more      exercise and try to lose 1/2 pound per week.  I suggested Weight      [employer]chers.  3. Hypertension.  Blood pressure is back up after discontinuation of      her lisinopril.  We will start her on Cozaar 50 mg daily.   DISPOSITION:  Return to clinic in 1 month to review the results of her  testing.     Bevelyn Buckles. Bensimhon, MD  Electronically Signed    DRB/MedQ  DD: 07/25/2006  DT: 07/25/2006  Job #: [id number]   cc:   Karie Schwalbe, MD  Dorian Pod, ACNP

## 2010-10-22 NOTE — Discharge Summary (Signed)
Katrina Andrews, Katrina Andrews              ACCOUNT NO.:  [id number]   MEDICAL RECORD NO.:  [id number]          PATIENT TYPE:  INP   LOCATION:  9317                          FACILITY:  [facility]   PHYSICIAN:  Ginger Carne, MD  DATE OF BIRTH:  1946-04-24   DATE OF ADMISSION:  03/18/2005  DATE OF DISCHARGE:  03/20/2005                                 DISCHARGE SUMMARY   REASON FOR HOSPITALIZATION:  Persistent complex left ovarian mass and  genuine urinary stress incontinence.   IN-HOSPITAL PROCEDURES:  1.  Laparoscopic-assisted vaginal hysterectomy.  2.  Bilateral salpingo-oophorectomy.  3.  Extensive lysis of adhesive disease.  4.  Tension-free vaginal tape procedure.  5.  Cystoscopy.   HOSPITAL COURSE:  This is a 65 year old, multiparous, Caucasian female who  underwent the aforementioned procedures on 18 March 2005.  The patient was  followed for 1 year by another facility in [place] with an enlarging left  adnexal mass.  In addition, she has had longstanding complaints of genuine  urinary stress incontinence.  Appropriate workup for both mass and  incontinence was performed preoperatively.  In addition, the patient had  demonstrated a right lower lobe infiltrative lesion deemed nonacute on chest  x-ray followed by non-contrast CT.  This will be followed; however, it  appears to be related to a possible previous pulmonary infection.  The  patient had no evidence preoperatively of acute atypical pneumonia including  fever, tachypnea, elevated white count, or productive cough.   Intraoperative course was uneventfully.  Postoperative course unremarkable.  Postoperative hematocrit 30.4, hemoglobin 11.4.  She was afebrile.  Abdomen  was soft.  Incisions were dry.  Calves without tenderness, and lungs were  clear.  The patient was able to void satisfactorily following removal of  Foley catheter.   She was provided routine postoperative instructions including contacting the  office for  temperature elevation above 100.4 degrees F, increasing abdominal  pain, incisional drainage, vaginal bleeding, constipation, or urinary tract  symptomatology.   She was prescribed:  1.  Percocet 5/325 one to two every 4 to 6 hours.  2.  Levaquin 500 mg 1 daily for 7 days.  3.  Continue all pre operative medications  After her postoperative visit in 4 to 5 weeks, a CT scan of the chest will  be performed.  If aforementioned right lower lobe lesion is noted, Dr.  [name] will be contacted for further evaluation including possible  evaluation by a pulmonologist.      Ginger Carne, MD  Electronically Signed     SHB/MEDQ  D:  03/20/2005  T:  03/20/2005  Job:  [id number]

## 2010-10-22 NOTE — Assessment & Plan Note (Signed)
[facility]                               PULMONARY OFFICE NOTE   NIJAE, DOYEL                       MRN:          [id number]  DATE:03/06/2006                            DOB:          July 25, 1945    Katrina Andrews comes in today for followup after her recent nocturnal  polysomnogram.  The patient was found to have very mild obstructive sleep  apnea with a respiratory disturbance index of 8 events per hour and O2  desaturation as low as 87%.  There was moderate to loud snoring noted.  The  patient was also noted to have 113 leg jerks with 11 per hour resulting in  arousal or awakening.  I had a long discussion with the patient about her  sleep study and have answered all questions.   PHYSICAL EXAM:  GENERAL:  She is an obese white female in no acute distress.  Blood pressure is 126/74, pulse 78, temperature 98.1, weight is 251 pounds,  O2 saturation on room air is 95%.   IMPRESSION:  1. Mild obstructive sleep apnea/hypopnea syndrome.  The patient certainly      is obese and has abnormal upper airway anatomy.  However, I think this      may not be a significant issue for her at this time.  She has other      issues that may be contributing to her daytime symptomatology.  I have      told her, however, that she must work on losing weight or this can get      worse over time.  2. 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  The      patient had large numbers of leg jerks that clearly was very disruptive      to sleep.  She does not have classic symptoms for restless legs but      does have the suggestion.  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  I think the patient needs      to go on a trial of a dopamine agonist to see if we can improve her      symptomatology and her sleep.  She is agreeable to this approach.  3. Psychophysiologic insomnia.  Hopefully when the patient begins to  sleep      better this will resolve itself.  If not, we will need to work on this      as well.   PLAN:  1. Trial of Requip in an escalating fashion up to 1 mg nightly.  2. Work on weight loss.  3. The patient is to followup in 3-4 weeks or sooner if there are      problems.  If she continues to have symptoms we may have to think about      her sleep apnea being more significant to her      symptoms than initially thought, and also may have to aggressively      address her insomnia issues.  ______________________________  Barbaraann Share, MD,FCCP      [facility]MC/MedQ  DD:  03/06/2006  DT:  03/06/2006  Job #:  [id number]   cc:   Karie Schwalbe, MD

## 2010-10-22 NOTE — Op Note (Signed)
Katrina Andrews, Katrina Andrews              ACCOUNT NO.:  [id number]   MEDICAL RECORD NO.:  [id number]          PATIENT TYPE:  OBV   LOCATION:  9317                          FACILITY:  [facility]   PHYSICIAN:  Ginger Carne, MD  DATE OF BIRTH:  29-Sep-1945   DATE OF PROCEDURE:  DATE OF DISCHARGE:                                 OPERATIVE REPORT   PREOPERATIVE DIAGNOSES:  1.  Left ovarian complex cystic mass.  2.  Genuine urinary stress incontinence.   POSTOPERATIVE DIAGNOSES:  1.  Left ovarian complex cystic mass.  2.  Genuine urinary stress incontinence.   PROCEDURES:  1.  Laparoscopically-assisted vaginal hysterectomy, bilateral salpingo-      oophorectomy.  2.  Tension-free vaginal tape procedure with cystoscopy.   SURGEON:  Ginger Carne, M.D.   ASSISTANT:  None.   COMPLICATIONS:  None immediate.   ESTIMATED BLOOD LOSS:  100 mL.   SPECIMENS:  Uterus, cervix, right and left tube and ovary.   ANESTHESIA:  General.   OPERATIVE FINDINGS:  The patient demonstrated what appeared to be evidence  of diverticular disease with possible past history of diverticulitis.  Extensive adhesions in the cul-de-sac of the rectosigmoid mesentery were  noted.  This extended to the right and left broad ligaments.  The left ovary  was about 5 cm in total dimension.  It had to be reduced in size to be  removed through the vaginal cuff.  There was straw-colored, clear fluid  noted, but there was particular matter in it and it was a smooth surface  without excrescences and not adherent to any organs.  The uterus was normal  in size, right tube and ovary normal.  No evidence of additional pathology.  The tension-free vaginal tape procedure followed by cystoscopy demonstrated  no evidence of violation of the bladder.  The dome, lateral walls, trigone  and urethra were identified without injury after the tape on either side was  placed in the retropubic region.   OPERATIVE PROCEDURE:  The patient  prepped and draped in the usual fashion  and placed in the lithotomy position.  Betadine solution used for  antiseptic.  The patient was catheterized prior to the procedure.  The  patient was placed in a neurologically safe position.   A vertical infraumbilical incision was made and a Veress needle placed in  the abdomen and opening and closing pressures were 10-15 mm.  A needle  release trocar placed in the same incision, laparoscope placed in the trocar  sleeve.  Afterwards two 5 mm ports were made under direct visualization in  the left lower quadrant and the left hypogastric regions.  The rectosigmoid  mesentery was meticulously dissected off the posterior aspect of the uterus,  the cervix, cul-de-sac and bilateral broad ligaments and pelvic sidewall on  the left side.  This was accomplished to facilitate the hysterectomy.  Following this the ureters were identified bilaterally and the right and  left infundibulopelvic ligaments were bipolar cauterized, cut including the  round ligaments.  At this point attention was directed to the vaginal  portion of the procedure.  The anterior and posterior vaginal epithelium  overlying the cervix were incised transversely, the peritoneal reflections  similarly identified, opened without injury to their respective organs.  The  uterosacral-cardinal ligament complexes clamped, cut and ligated with 0  Vicryl suture.  This was extended to the uterine vasculature, its ascending  branches and broad ligaments.  The right and left tube and ovary and uterus  and cervix were removed.  Bleeding points hemostatically checked.  A wet lap  was placed in the vaginal canal, re-scoping performed, bleeding points  hemostatically checked.  Copious irrigation with lactated Ringer's followed.  This was then followed by gas released, trocars removed, closure of the 10  mm fascia site with 0 Vicryl suture and Dermabond used for the 5 mm sites.  The TVT portion  followed.   The anterior vaginal epithelium was incised in the midline for approximately  3 cm.  She had had a previous vaginal bladder repair, and this required  extensive careful dissection of the pubovesical cervical fascia off the  vaginal epithelium.  Following this, using an advantage TVT system from  bottom up, the mesh was placed 1 cm lateral to the symphysis pubis on either  side, hugging the posterior aspect of the pubic bone.  Cystoscopy followed  and, as aforementioned, no injury noted.  Afterwards fluid removed, bladder  filled to 250 mL and appropriate tensioning followed.  Fluid was then  drained from the bladder.  The tape was then cut beneath the skin and  Dermabond used for closure.  Copious irrigation with lactated Ringer's  followed.  Closure of the vaginal epithelium with 3-0 Vicryl running  interlocking suture.  The patient tolerated the procedure well and returned  to the postanesthesia recovery room in excellent condition.      Ginger Carne, MD  Electronically Signed     SHB/MEDQ  D:  03/18/2005  T:  03/18/2005  Job:  [id number]

## 2010-10-22 NOTE — Assessment & Plan Note (Signed)
[facility] OFFICE NOTE   Katrina Andrews, Katrina Andrews                       MRN:          [id number]  DATE:08/22/2006                            DOB:          Mar 03, 1946    INTERVAL HISTORY:  Katrina Andrews is a delightful 65 year old woman who is a  friend of Katrina Andrews.  She has a history of morbid obesity,  hypertension, and sleep apnea who returns today for followup on her  dyspnea.  Since we last saw her, she got an echocardiogram which showed  an EF of 55% to 60%.  There was no significant valvular abnormalities.  There was no evidence of elevated pulmonary pressures.  There was a  grade 1 diastolic dysfunction.  She also underwent a nuclear study,  which showed an EF of 68% with no scar or ischemia.  Since that time,  she says she has been doing relatively well.  She still does have mild  shortness of breath, but this is really unchanged.  She is able to do  all of her activities without significant limitation.  She does not  exercise regularly.  She does have some mild lower extremity at times,  which clears up with her Maxzide.  Unfortunately, she remains  noncompliant with her CPAP.   CURRENT MEDICATIONS:  1. Paxil 20 a day.  2. Phentermine.  3. Requip.  4. Sanctura XR 60 a day.  5. Fish oil.  6. Aspirin 81.  7. Maxzide 37.5/25.  8. Cozaar 50 a day.  9. Keflex for a sinus infection.   PHYSICAL EXAMINATION:  She ambulates around the clinic without any  respiratory difficulty.  Is well appearing.  She does have what sounds  like a little bit of sinus congestion.  Blood pressure is 140/76.  Heart  rate is 90.  Weight is 250.  HEENT:  Sclerae anicteric.  EOMI.  There is no xanthelasma.  Mucous  membranes are moist.  Oropharynx is clear.  NECK:  Supple.  No evidence of JVD.  Carotids are 2+ bilaterally without  any bruits.  There is no lymphadenopathy or thyromegaly.  CARDIAC:  Regular rate and rhythm  with a soft S4.  No murmur.  LUNGS:  Clear.  ABDOMEN:  Obese, non-tender, and non-distended.  No evidence of  hepatosplenomegaly appreciated.  No bruits.  No masses.  Good bowel  sounds.  EXTREMITIES:  Warm with no cyanosis or clubbing.  There is 1+ edema  bilaterally, but no rashes.  NEUROLOGIC:  Alert and oriented x3.  Cranial nerves II-XII are intact.  Moves all 4 extremities without difficulty.  Her affect is pleasant.   Accessory Data:  BNP is 23.   ASSESSMENT AND PLAN:  1. Dyspnea.  She does have some mild diastolic dysfunction, but      otherwise her cardiac workup is negative.  I suspect most of her      dyspnea is related to deconditioning and her weight.  I stressed to      her the need for saving some time for exercise.  We also discussed  her going back to Dr. Shelle Iron and getting back on her CPAP.  2. Hypertension.  Blood pressure is elevated.  We will increase her      Cozaar to 100 mg a day.  I suspect she will need a 3rd agent down      the road.  3. Glucose intolerance.  Most recent fasting glucose was 110.  I told      her she was a pre-diabetic.  She will follow up with Dr. [name].   DISPOSITION:  Return to clinic in 2 months for followup.     Bevelyn Buckles. Bensimhon, MD  Electronically Signed    DRB/MedQ  DD: 08/22/2006  DT: 08/22/2006  Job #: [id number]   cc:   Karie Schwalbe, MD

## 2010-11-09 ENCOUNTER — Ambulatory Visit: Payer: Self-pay | Admitting: Internal Medicine

## 2010-11-22 ENCOUNTER — Encounter: Payer: Self-pay | Admitting: Cardiovascular Disease

## 2010-11-23 ENCOUNTER — Ambulatory Visit: Payer: Self-pay | Admitting: Internal Medicine

## 2010-12-06 ENCOUNTER — Ambulatory Visit (INDEPENDENT_AMBULATORY_CARE_PROVIDER_SITE_OTHER): Payer: 59 | Admitting: Internal Medicine

## 2010-12-06 ENCOUNTER — Encounter: Payer: Self-pay | Admitting: Internal Medicine

## 2010-12-06 VITALS — BP 150/80 | HR 120 | Temp 101.0°F | Ht 61.0 in | Wt 243.0 lb

## 2010-12-06 DIAGNOSIS — J019 Acute sinusitis, unspecified: Secondary | ICD-10-CM

## 2010-12-06 DIAGNOSIS — M069 Rheumatoid arthritis, unspecified: Secondary | ICD-10-CM

## 2010-12-06 DIAGNOSIS — F3289 Other specified depressive episodes: Secondary | ICD-10-CM

## 2010-12-06 DIAGNOSIS — I10 Essential (primary) hypertension: Secondary | ICD-10-CM

## 2010-12-06 DIAGNOSIS — F329 Major depressive disorder, single episode, unspecified: Secondary | ICD-10-CM

## 2010-12-06 MED ORDER — AMOXICILLIN 500 MG PO TABS: 1000.0000 mg | ORAL_TABLET | Freq: Two times a day (BID) | ORAL | Status: AC

## 2010-12-06 MED ORDER — METHOTREXATE 2.5 MG PO TABS: 10.0000 mg | ORAL_TABLET | ORAL | Status: AC

## 2010-12-06 NOTE — Assessment & Plan Note (Signed)
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

## 2010-12-06 NOTE — Assessment & Plan Note (Signed)
BP Readings from Last 3 Encounters:  12/06/10 150/80  05/17/10 140/80  10/23/09 110/60   Has been okay No change with today's

## 2010-12-06 NOTE — Assessment & Plan Note (Signed)
Symptoms for 2 weeks  Likely bacterial infection Will treat with amoxil

## 2010-12-06 NOTE — Assessment & Plan Note (Signed)
Has been getting over stress of losing cat (has new one) and betrayal of friend On the paxil May need to consider increasing

## 2010-12-06 NOTE — Progress Notes (Signed)
Subjective:    Patient ID: Katrina Andrews, female    DOB: November 28, 1945, 65 y.o.   MRN: [id number]  HPI Has been using advil bid Helps some for the arthritis Feels it in hands and feet a lot  "I just don't feel good" Tried the paxil in the morning --thinks this may help more than at bedtime Awakens tired, esp the last couple of weeks with illness Had been depressed over losing cat--but has worked through this  Has had cough for 2 weeks Esp bad at night Gets spells of cough that cause reflux or nausea Feels something in her head as she raises it---"like something is trying to balance". Not dizzy though  Feels tight in upper chest ---across both sides and into back This seems related to cough Fever today and felt cold at work yesterday Has vomited phelgm---notes some PND  Cough seems dry Some SOB---when bending over if walking. No change over 2 months  Current Outpatient Prescriptions on File Prior to Visit  Medication Sig Dispense Refill  . fesoterodine (TOVIAZ) 4 MG TB24 Take 4 mg by mouth daily.        . furosemide (LASIX) 20 MG tablet Take 20 mg by mouth daily as needed.        Marland Kitchen PARoxetine (PAXIL) 20 MG tablet Take one by mouth daily  30 tablet  11  . rOPINIRole (REQUIP) 1 MG tablet Take 1 mg by mouth at bedtime.        . triamterene-hydrochlorothiazide (DYAZIDE) 37.5-25 MG per capsule Take 1 capsule by mouth every morning.          No Known Allergies  Past Medical History  Diagnosis Date  . Allergy   . Depression   . Diverticulitis     colon  . Hyperlipidemia   . Hypertension   . Ovarian cyst   . Urinary incontinence     Past Surgical History  Procedure Date  . Cholecystectomy   . Abdominal hysterectomy   . Incontinence surgery 03/2005  . Ankle fracture surgery 1983    left, screw  . Vaginal delivery     x3  . Knee cartilage surgery 2000    torn, left knee no surgery  . Dobutamine stress echo 09/1995  . Laparoscopic gastric banding 09/2007  . Carpal  tunnel release 1/12    Dr Hyacinth Meeker    Family History  Problem Relation Age of Onset  . COPD Mother   . Bone cancer Father   . Testicular cancer Brother   . Parkinsonism Paternal Aunt   . Diabetes Neg Hx     History   Social History  . Marital Status: Legally Separated    Spouse Name: N/A    Number of Children: 3  . Years of Education: N/A   Occupational History  . retired Engineer, civil (consulting)   . 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 part-time at [employer]    Social History Main Topics  . Smoking status: Former Smoker    Types: Cigarettes    Quit date: 06/06/1984  . Smokeless tobacco: Not on file  . Alcohol Use: No  . Drug Use: No  . Sexually Active: Not on file   Other Topics Concern  . Not on file   Social History Narrative  . No narrative on file   Review of Systems Has gained more weight Has reconciled herself to the friend's  Betrayal Had been on cozaar but she stopped this     Objective:  Physical Exam  Constitutional: She appears well-developed and well-nourished. No distress.  HENT:  Right Ear: External ear normal.  Left Ear: External ear normal.  Mouth/Throat: Oropharynx is clear and moist. No oropharyngeal exudate.       Mild frontal tenderness Moderate nasal congestion on the right  Neck: Normal range of motion. Neck supple.  Pulmonary/Chest: Effort normal and breath sounds normal. No respiratory distress. She has no wheezes. She has no rales.       Coarse cough  Musculoskeletal:       Synovitis in 8/10 PIP joints in hands Not clearly active in feet or other joints Chronic shoulder pain  Lymphadenopathy:    She has no cervical adenopathy.  Psychiatric: She has a normal mood and affect. Her behavior is normal. Judgment and thought content normal.       Anxious but related to illness and effect on sleep          Assessment & Plan:

## 2010-12-07 ENCOUNTER — Ambulatory Visit: Payer: Self-pay | Admitting: Internal Medicine

## 2011-01-20 ENCOUNTER — Ambulatory Visit (INDEPENDENT_AMBULATORY_CARE_PROVIDER_SITE_OTHER)
Admission: RE | Admit: 2011-01-20 | Discharge: 2011-01-20 | Disposition: A | Discharge status: Home / Self Care | Payer: 59 | Source: Ambulatory Visit | Attending: Internal Medicine | Admitting: Internal Medicine

## 2011-01-20 ENCOUNTER — Encounter: Payer: Self-pay | Admitting: Internal Medicine

## 2011-01-20 ENCOUNTER — Ambulatory Visit (INDEPENDENT_AMBULATORY_CARE_PROVIDER_SITE_OTHER): Payer: 59 | Admitting: Internal Medicine

## 2011-01-20 VITALS — BP 140/80 | HR 97 | Temp 98.3°F | Resp 16 | Ht 61.0 in | Wt 246.0 lb

## 2011-01-20 DIAGNOSIS — F329 Major depressive disorder, single episode, unspecified: Secondary | ICD-10-CM

## 2011-01-20 DIAGNOSIS — R05 Cough: Secondary | ICD-10-CM

## 2011-01-20 DIAGNOSIS — R059 Cough, unspecified: Secondary | ICD-10-CM

## 2011-01-20 DIAGNOSIS — J45909 Unspecified asthma, uncomplicated: Secondary | ICD-10-CM | POA: Insufficient documentation

## 2011-01-20 DIAGNOSIS — F3289 Other specified depressive episodes: Secondary | ICD-10-CM

## 2011-01-20 MED ORDER — PREDNISONE 20 MG PO TABS: 40.0000 mg | ORAL_TABLET | Freq: Every day | ORAL | Status: AC

## 2011-01-20 MED ORDER — HYDROCODONE-HOMATROPINE 5-1.5 MG/5ML PO SYRP: 5.0000 mL | ORAL_SOLUTION | Freq: Every evening | ORAL | Status: AC | PRN

## 2011-01-20 MED ORDER — ALBUTEROL SULFATE HFA 108 (90 BASE) MCG/ACT IN AERS: 2.0000 | INHALATION_SPRAY | Freq: Four times a day (QID) | RESPIRATORY_TRACT | Status: DC | PRN

## 2011-01-20 MED ORDER — CEFUROXIME AXETIL 250 MG PO TABS: 250.0000 mg | ORAL_TABLET | Freq: Two times a day (BID) | ORAL | Status: AC

## 2011-01-20 NOTE — Assessment & Plan Note (Signed)
CXR shows no pneumonia but ?increased bronchial markings Spirometry shows some obstruction but more of low tidal volume (shallow breathing) Likely started as viral infection and then mold exposure Will treat with ceftin, prednisone and albuterol Hydrocodone for cough

## 2011-01-20 NOTE — Progress Notes (Signed)
Subjective:    Patient ID: Katrina Andrews, female    DOB: [birth year], 65 y.o.   MRN: [id number]  HPI Did get over last sinus infection  Started with bad cough about 63 week ago--initially dry Trip to [place] Got achy Cough worsened Noted sig trouble breathing ---productive cough with any movement DOE now also--just walking in house Feels weak  No fever but she feels cold--with chills No sweats or shakes  No sore throat but it feels swollen Does feel PND Some nasal discharge--clear and sometime colored No ear pain  Depression is better Has gotten over last month's stressors  Joints seem better but hard to tell if it is MTX Uses ibuprofen also  Current Outpatient Prescriptions on File Prior to Visit  Medication Sig Dispense Refill  . fesoterodine (TOVIAZ) 4 MG TB24 Take 4 mg by mouth daily.        . furosemide (LASIX) 20 MG tablet Take 20 mg by mouth daily as needed.        Marland Kitchen PARoxetine (PAXIL) 20 MG tablet Take one by mouth daily  30 tablet  11  . rOPINIRole (REQUIP) 1 MG tablet Take 1 mg by mouth at bedtime.        . triamterene-hydrochlorothiazide (DYAZIDE) 37.5-25 MG per capsule Take 1 capsule by mouth every morning.          No Known Allergies  Past Medical History  Diagnosis Date  . Allergy   . Depression   . Diverticulitis     colon  . Hyperlipidemia   . Hypertension   . Ovarian cyst   . Urinary incontinence     Past Surgical History  Procedure Date  . Cholecystectomy   . Abdominal hysterectomy   . Incontinence surgery 03/2005  . Ankle fracture surgery 1983    left, screw  . Vaginal delivery     x3  . Knee cartilage surgery 2000    torn, left knee no surgery  . Dobutamine stress echo 09/1995  . Laparoscopic gastric banding 09/2007  . Carpal tunnel release 1/12    Dr Hyacinth Meeker    Family History  Problem Relation Age of Onset  . COPD Mother   . Bone cancer Father   . Testicular cancer Brother   . Parkinsonism Paternal Aunt   . Diabetes Neg Hx      History   Social History  . Marital Status: Legally Separated    Spouse Name: N/A    Number of Children: 3  . Years of Education: N/A   Occupational History  . retired Engineer, civil (consulting)   . 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 part-time at [employer]    Social History Main Topics  . Smoking status: Former Smoker    Types: Cigarettes    Quit date: 06/06/1984  . Smokeless tobacco: Never Used  . Alcohol Use: No  . Drug Use: No  . Sexually Active: Not on file   Other Topics Concern  . Not on file   Social History Narrative  . No narrative on file   Review of Systems No vomiting Slight loose stools Appetite is off now    Objective:   Physical Exam  Constitutional: She appears well-developed and well-nourished.       Coarse cough Moving slowly NAD  HENT:  Head: Normocephalic and atraumatic.  Right Ear: External ear normal.  Left Ear: External ear normal.  Mouth/Throat: Oropharynx is clear and moist. No oropharyngeal exudate.  TMs fine Mild nasal congestion with some greenish mucus on left  Neck: Normal range of motion. Neck supple.  Pulmonary/Chest: Effort normal. No respiratory distress.       Mild decreased breath sounds  Faint bibasilar crackles Mild exp phase prolongation and wheezes  Lymphadenopathy:    She has no cervical adenopathy.          Assessment & Plan:

## 2011-01-20 NOTE — Patient Instructions (Signed)
Please keep next month's appt

## 2011-01-20 NOTE — Assessment & Plan Note (Signed)
Improved again Over the stressors from last month

## 2011-02-08 ENCOUNTER — Ambulatory Visit: Payer: 59 | Admitting: Internal Medicine

## 2011-02-22 ENCOUNTER — Ambulatory Visit (INDEPENDENT_AMBULATORY_CARE_PROVIDER_SITE_OTHER): Payer: 59 | Admitting: Internal Medicine

## 2011-02-22 DIAGNOSIS — F329 Major depressive disorder, single episode, unspecified: Secondary | ICD-10-CM

## 2011-02-22 DIAGNOSIS — M069 Rheumatoid arthritis, unspecified: Secondary | ICD-10-CM

## 2011-02-22 DIAGNOSIS — I10 Essential (primary) hypertension: Secondary | ICD-10-CM

## 2011-02-22 DIAGNOSIS — F3289 Other specified depressive episodes: Secondary | ICD-10-CM

## 2011-03-01 LAB — DIFFERENTIAL
Basophils Relative: 0
Eosinophils Absolute: 0
Lymphs Abs: 1
Monocytes Absolute: 0.6
Monocytes Relative: 9
Neutro Abs: 5.5

## 2011-03-01 LAB — BASIC METABOLIC PANEL
BUN: 21
Chloride: 104
Glucose, Bld: 116 — ABNORMAL HIGH
Potassium: 3.9

## 2011-03-01 LAB — CBC
Hemoglobin: 11.5 — ABNORMAL LOW
MCHC: 34.9
MCV: 81.5
RBC: 4.06

## 2011-03-04 ENCOUNTER — Telehealth (INDEPENDENT_AMBULATORY_CARE_PROVIDER_SITE_OTHER): Payer: Self-pay | Admitting: Surgery

## 2011-03-04 NOTE — Telephone Encounter (Signed)
03/04/11 recall letter mailed to pt for lap band f/u. Advised pt to call our office to schedule an appt...cef °

## 2011-03-24 ENCOUNTER — Encounter: Payer: Self-pay | Admitting: Internal Medicine

## 2011-03-24 ENCOUNTER — Ambulatory Visit (INDEPENDENT_AMBULATORY_CARE_PROVIDER_SITE_OTHER): Payer: 59 | Admitting: Internal Medicine

## 2011-03-24 VITALS — BP 124/80 | HR 76 | Temp 98.2°F | Resp 16 | Ht 61.0 in | Wt 245.5 lb

## 2011-03-24 DIAGNOSIS — F3289 Other specified depressive episodes: Secondary | ICD-10-CM

## 2011-03-24 DIAGNOSIS — R05 Cough: Secondary | ICD-10-CM | POA: Insufficient documentation

## 2011-03-24 DIAGNOSIS — J45909 Unspecified asthma, uncomplicated: Secondary | ICD-10-CM

## 2011-03-24 DIAGNOSIS — F329 Major depressive disorder, single episode, unspecified: Secondary | ICD-10-CM

## 2011-03-24 DIAGNOSIS — R059 Cough, unspecified: Secondary | ICD-10-CM

## 2011-03-24 DIAGNOSIS — J309 Allergic rhinitis, unspecified: Secondary | ICD-10-CM

## 2011-03-24 MED ORDER — MONTELUKAST SODIUM 10 MG PO TABS: 10.0000 mg | ORAL_TABLET | Freq: Every day | ORAL | Status: DC

## 2011-03-24 NOTE — Assessment & Plan Note (Signed)
Children are moving 1.5-2 hours away Very upset Trying to deal with this Not moving with him but trying to adjust counselled

## 2011-03-24 NOTE — Assessment & Plan Note (Signed)
Has had recurrence of what could be asthma Spirometry was better than 2 months ago with pre FVC of 2.05l and FEV1 of 1.58l Post bronchodilator was 2.15 and 1.72 (marginal 10% increase in FEV1)  This may just be allergic asthma  Will try singulair for this Albuterol prn No inhaled steroid for now

## 2011-03-24 NOTE — Progress Notes (Signed)
Subjective:    Patient ID: Katrina Andrews, female    DOB: 1945-09-04, 65 y.o.   MRN: [id number]  HPI Did get over last illness Cough restarted about 1 week ago Only occ sputum---thick and yellow, now mostly clear but thick  Doesn't really feel sick Not sleeping well so she feels tired  No sore throat--just irritated from cough Some nasal drip but no sig congestion No ear pain  No wheezing Does get out of breath easy with exertion (vacuuming, going up stairs, walking for a while)  Albuterol MDI may have loosened up cough somewhat Only tried once  Current Outpatient Prescriptions on File Prior to Visit  Medication Sig Dispense Refill  . albuterol (VENTOLIN HFA) 108 (90 BASE) MCG/ACT inhaler Inhale 2 puffs into the lungs every 6 (six) hours as needed for wheezing.  1 Inhaler  1  . fesoterodine (TOVIAZ) 4 MG TB24 Take 4 mg by mouth daily.        . furosemide (LASIX) 20 MG tablet Take 20 mg by mouth daily as needed.        Marland Kitchen PARoxetine (PAXIL) 20 MG tablet Take one by mouth daily  30 tablet  11  . rOPINIRole (REQUIP) 1 MG tablet Take 1 mg by mouth at bedtime.        . triamterene-hydrochlorothiazide (DYAZIDE) 37.5-25 MG per capsule Take 1 capsule by mouth every morning.          No Known Allergies  Past Medical History  Diagnosis Date  . Allergy   . Depression   . Diverticulitis     colon  . Hyperlipidemia   . Hypertension   . Ovarian cyst   . Urinary incontinence     Past Surgical History  Procedure Date  . Cholecystectomy   . Abdominal hysterectomy   . Incontinence surgery 03/2005  . Ankle fracture surgery 1983    left, screw  . Vaginal delivery     x3  . Knee cartilage surgery 2000    torn, left knee no surgery  . Dobutamine stress echo 09/1995  . Laparoscopic gastric banding 09/2007  . Carpal tunnel release 1/12    Dr Hyacinth Meeker    Family History  Problem Relation Age of Onset  . COPD Mother   . Bone cancer Father   . Testicular cancer Brother   .  Parkinsonism Paternal Aunt   . Diabetes Neg Hx     History   Social History  . Marital Status: Legally Separated    Spouse Name: N/A    Number of Children: 3  . Years of Education: N/A   Occupational History  . retired Engineer, civil (consulting)   . 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 part-time at [employer]    Social History Main Topics  . Smoking status: Former Smoker    Types: Cigarettes    Quit date: 06/06/1984  . Smokeless tobacco: Never Used  . Alcohol Use: No  . Drug Use: No  . Sexually Active: Not on file   Other Topics Concern  . Not on file   Social History Narrative  . No narrative on file     Review of Systems No nausea or vomiting Able to eat okay    Objective:   Physical Exam  Constitutional: She appears well-developed and well-nourished. No distress.  HENT:  Right Ear: External ear normal.  Left Ear: External ear normal.  Mouth/Throat: Oropharynx is clear and moist. No oropharyngeal exudate.       No  sinus tenderness Moderate nasal congestion without much discharge  Neck: Normal range of motion. Neck supple. No thyromegaly present.  Cardiovascular: Normal rate, regular rhythm and normal heart sounds.   Pulmonary/Chest: Effort normal. No respiratory distress. She has no wheezes. She has no rales.       Slight rhonchi Not tight  Lymphadenopathy:    She has no cervical adenopathy.          Assessment & Plan:

## 2011-03-24 NOTE — Patient Instructions (Signed)
Please start the singulair daily (montelukast) Please take over the counter cetirizine 10mg . Can also use loratadine10mg  1--2 daily

## 2011-03-24 NOTE — Assessment & Plan Note (Signed)
Needs more aggressive Rx for this ---at least seasonally Will use OTC antihistamines and add singulair

## 2011-04-05 ENCOUNTER — Other Ambulatory Visit: Payer: Self-pay | Admitting: *Deleted

## 2011-04-05 MED ORDER — ROPINIROLE HCL 1 MG PO TABS: 1.0000 mg | ORAL_TABLET | Freq: Every day | ORAL | Status: DC

## 2011-04-05 NOTE — Telephone Encounter (Signed)
Okay to refill for 1 year. 

## 2011-04-06 NOTE — Progress Notes (Signed)
The EMR experienced a system downtime. The visit documentation was completed on paper and billing was entered by the [facility] billing department. The paper document is to be scanned into [facility].  Encounter closed on the behalf of the provider.

## 2011-04-08 ENCOUNTER — Inpatient Hospital Stay (HOSPITAL_COMMUNITY)
Admission: EM | Admit: 2011-04-08 | Discharge: 2011-04-11 | DRG: 541 | Disposition: A | Discharge status: Home / Self Care | Payer: BC Managed Care – PPO | Source: Ambulatory Visit | Attending: Internal Medicine | Admitting: Internal Medicine

## 2011-04-08 ENCOUNTER — Emergency Department (HOSPITAL_COMMUNITY): Payer: BC Managed Care – PPO

## 2011-04-08 DIAGNOSIS — R32 Unspecified urinary incontinence: Secondary | ICD-10-CM | POA: Diagnosis present

## 2011-04-08 DIAGNOSIS — F3289 Other specified depressive episodes: Secondary | ICD-10-CM

## 2011-04-08 DIAGNOSIS — R05 Cough: Secondary | ICD-10-CM

## 2011-04-08 DIAGNOSIS — E876 Hypokalemia: Secondary | ICD-10-CM | POA: Diagnosis not present

## 2011-04-08 DIAGNOSIS — F329 Major depressive disorder, single episode, unspecified: Secondary | ICD-10-CM

## 2011-04-08 DIAGNOSIS — J189 Pneumonia, unspecified organism: Secondary | ICD-10-CM | POA: Diagnosis present

## 2011-04-08 DIAGNOSIS — R0602 Shortness of breath: Secondary | ICD-10-CM

## 2011-04-08 DIAGNOSIS — T380X5A Adverse effect of glucocorticoids and synthetic analogues, initial encounter: Secondary | ICD-10-CM | POA: Diagnosis not present

## 2011-04-08 DIAGNOSIS — G4733 Obstructive sleep apnea (adult) (pediatric): Secondary | ICD-10-CM

## 2011-04-08 DIAGNOSIS — I5033 Acute on chronic diastolic (congestive) heart failure: Secondary | ICD-10-CM | POA: Diagnosis not present

## 2011-04-08 DIAGNOSIS — M2550 Pain in unspecified joint: Secondary | ICD-10-CM

## 2011-04-08 DIAGNOSIS — I270 Primary pulmonary hypertension: Secondary | ICD-10-CM

## 2011-04-08 DIAGNOSIS — N83209 Unspecified ovarian cyst, unspecified side: Secondary | ICD-10-CM

## 2011-04-08 DIAGNOSIS — R059 Cough, unspecified: Secondary | ICD-10-CM

## 2011-04-08 DIAGNOSIS — J309 Allergic rhinitis, unspecified: Secondary | ICD-10-CM

## 2011-04-08 DIAGNOSIS — K219 Gastro-esophageal reflux disease without esophagitis: Secondary | ICD-10-CM | POA: Diagnosis present

## 2011-04-08 DIAGNOSIS — R7309 Other abnormal glucose: Secondary | ICD-10-CM | POA: Diagnosis not present

## 2011-04-08 DIAGNOSIS — I509 Heart failure, unspecified: Secondary | ICD-10-CM | POA: Diagnosis present

## 2011-04-08 DIAGNOSIS — J45909 Unspecified asthma, uncomplicated: Secondary | ICD-10-CM | POA: Diagnosis present

## 2011-04-08 DIAGNOSIS — M069 Rheumatoid arthritis, unspecified: Secondary | ICD-10-CM | POA: Diagnosis present

## 2011-04-08 DIAGNOSIS — R7301 Impaired fasting glucose: Secondary | ICD-10-CM

## 2011-04-08 DIAGNOSIS — J45901 Unspecified asthma with (acute) exacerbation: Secondary | ICD-10-CM | POA: Diagnosis present

## 2011-04-08 DIAGNOSIS — I10 Essential (primary) hypertension: Secondary | ICD-10-CM

## 2011-04-08 DIAGNOSIS — K5730 Diverticulosis of large intestine without perforation or abscess without bleeding: Secondary | ICD-10-CM

## 2011-04-08 LAB — POCT I-STAT 3, ART BLOOD GAS (G3+)
Acid-base deficit: 3 mmol/L — ABNORMAL HIGH (ref 0.0–2.0)
O2 Saturation: 96 %
pO2, Arterial: 81 mmHg (ref 80.0–100.0)

## 2011-04-08 LAB — CBC
HCT: 40.9 % (ref 36.0–46.0)
MCH: 29.1 pg (ref 26.0–34.0)
MCHC: 32.8 g/dL (ref 30.0–36.0)
RDW: 13.6 % (ref 11.5–15.5)

## 2011-04-08 LAB — BASIC METABOLIC PANEL
BUN: 13 mg/dL (ref 6–23)
Calcium: 9 mg/dL (ref 8.4–10.5)
Creatinine, Ser: 0.73 mg/dL (ref 0.50–1.10)
GFR calc Af Amer: >90 mL/min (ref >90)
GFR calc non Af Amer: 89 mL/min — ABNORMAL LOW (ref >90)
Glucose, Bld: 152 mg/dL — ABNORMAL HIGH (ref 70–99)
Potassium: 3.5 mEq/L (ref 3.5–5.1)

## 2011-04-08 LAB — DIFFERENTIAL
Basophils Absolute: 0 10*3/uL (ref 0.0–0.1)
Eosinophils Relative: 1 % (ref 0–5)
Lymphocytes Relative: 7 % — ABNORMAL LOW (ref 12–46)
Monocytes Absolute: 0.3 10*3/uL (ref 0.1–1.0)
Monocytes Relative: 3 % (ref 3–12)

## 2011-04-08 LAB — GLUCOSE, CAPILLARY: Glucose-Capillary: 188 mg/dL — ABNORMAL HIGH (ref 70–99)

## 2011-04-08 NOTE — H&P (Signed)
Katrina Andrews, Katrina Andrews NO.:  [id number]  MEDICAL RECORD NO.:  [id number]  LOCATION:  [facility]                         FACILITY:  [facility]  PHYSICIAN:  Katrina Scott, MD     DATE OF BIRTH:  1945/07/10  DATE OF ADMISSION:  04/08/2011 DATE OF DISCHARGE:                             HISTORY & PHYSICAL   PRIMARY CARE PHYSICIAN:  Katrina Schwalbe, MD, of [facility] at [facility].  CHIEF COMPLAINT:  Worsening dyspnea and cough.  HISTORY OF PRESENTING ILLNESS:  Katrina Andrews is a pleasant 65 year old Caucasian female patient with history of hypertension, asthmatic bronchitis, restless legs syndrome, and mild obstructive sleep apnea who has voluntarily discontinued CPAP approximately 2 years ago.  She has been having dry coughing spells mostly at night with associated dyspnea ongoing for 3-5 months.  She also complains of dyspnea on exertion such as while climbing stairs during the same period of time.  She denies chest pain.  She has been seen by her primary care physician 3 times and in this period.  The first time she was treated as asthmatic bronchitis with a course of antibiotics with (unknown), steroid course.  The next time she was told to have bronchitis and the last time she was seen about 2 weeks ago and was thought to have these symptoms secondary to allergies and was started on Zyrtec and Singulair.  She indicates that these treatments have only made mild improvement in her general condition.  She has continued to have mostly dry hacking cough especially at night with associated difficulty breathing after the coughing spells and some chest soreness after coughing.  She denies any fever or chills.  She does volunteer to significant symptom of reflux 3- 4 hours after eating.  These symptoms occur mostly at night and sometimes she gets up and takes a drink of water or eats something to relieve the symptoms.  Last night, she woke up at about 11:15  with worsening cough and dyspnea.  She used her inhaler with no significant relief.  The symptoms continued to get worse at about 3 this morning. She again used her inhaler with no improvement and her dyspnea was continuing to worsen and she was getting panicky.  She called EMS.  EMS found her oxygen saturations to be 89%.  She was transported to the [facility] Emergency Room.  Here she received continuous nebulization. Her chest x-ray revealed pneumonia.  She was given a dose of Solu-Medrol IV, Rocephin, and Zithromax.  She indicates that she is feeling 95% better with improvement in her breathing and coughing.  The triad hospitalist were requested to admit her for further evaluation and management.  PAST MEDICAL HISTORY: 1. Hypertension. 2. Nuclear stress test done by Katrina Andrews in 2007 said to be     normal. 3. Asthmatic bronchitis/allergies.  The patient apparently had lung     function tests done by her primary care physician's office 2 weeks     ago and was told that she does not have COPD and may have     asthma/asthmatic bronchitis. 4. Obstructive sleep apnea.  The patient indicates that she had a mild  case of this and has voluntarily stopped using CPAP approximately 2     years ago. 5. Restless legs syndrome. 6. Bulging disk with chronic low back pain. 7. Positive rheumatoid factor. 8. Torn meniscus in both knees. 9. Urinary incontinence.  PAST SURGICAL HISTORY: 1. Laparoscopic banding for weight loss. 2. Right carpal tunnel release. 3. Tubal ligation. 4. Hysterectomy. 5. Bladder lift surgery. 6. Left ankle surgery. 7. Right foot surgery. 8. Cholecystectomy.  ALLERGIES:  No known drug allergies.  HOME MEDICATIONS: 1. Ketoconazole cream 2% topically daily p.r.n. for dry skin. 2. Fiber tablet over the counter 1 tablet daily p.r.n. constipation. 3. Melatonin 5 mg p.o. daily nightly p.r.n. for insomnia. 4. Acetaminophen 1300 mg p.o. daily for arthritic  pain. 5. Ropinirole 1 mg p.o. nightly. 6. Triamterene/hydrochlorothiazide 37.5/25 mg p.o. daily p.r.n. for     swelling. 7. Paxil 20 mg p.o. daily. 8. Cetirizine 10 mg p.o. nightly. 9. Singulair 10 mg p.o. nightly. 10.Albuterol inhaler 2 puffs q.4 hourly p.r.n. for wheeze or dyspnea.  FAMILY HISTORY: 1. Mother is demised and had COPD. 2. Father is demised and had multiple myeloma. 3. Brother is alive and treated for testicular cancer. 4. Family history positive for heart disease.  SOCIAL HISTORY:  The patient is divorced.  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  She is independent of activities of daily living.  She has a remote history of smoking and quit in 1985.  There is no history of alcohol or drug abuse. 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  There is no history of recent long-distance travel.  ADVANCE DIRECTIVES:  Full code.  REVIEW OF SYSTEMS:  All systems reviewed and apart from history of presenting illness is pertinent for intermittent joint pains in her ankles and hands.  She has tried Aleve and Naprosyn in the past with no significant change and is currently controlled on p.r.n.  Tylenol.  She has no skin rashes.  She does complain of urinary incontinence after the bladder lift surgery.  There is no delusions or hallucinations or suicidal or homicidal ideations.  PHYSICAL EXAMINATION:  GENERAL:  Katrina Andrews is a moderately built and obese female patient, who is currently in no obvious distress. VITAL SIGNS:  Temperature 99.1 degrees Fahrenheit, blood pressure on arrival was 178/53 which is now 131/66 mmHg, pulse 111 per minute and regular, respiration 19 per minute, and saturating at 97% on oxygen. HEAD, EYE, ENT:  Nontraumatic and normocephalic.  Pupils are equally reacting to light and accommodation.  Bilateral immature cataract.  Oral mucosa mildly dry.  Mild redness of the  posterior pharyngeal wall, but there is no drainage or other acute findings. NECK:  Supple.  No JVD or carotid bruit. LYMPHATICS:  No lymphadenopathy. RESPIRATORY:  Slightly reduced breath sounds bilaterally with bilateral occasional expiratory rhonchi.  No wheezing or stridor.  No increased work of breathing.  Few crackles in the left anterior mid chest. CARDIOVASCULAR:  First and second heart sounds heard.  Regular, tachycardic.  No murmurs or JVD. ABDOMEN:  Obese, nontender, soft, and normal bowel sounds heard.  No organomegaly or mass appreciated. CENTRAL NERVOUS SYSTEM:  The patient is awake, alert, and oriented x3 with no focal neurological deficits. EXTREMITIES:  With trace leg edema, but no other acute findings.  Grade 5/5 power and symmetrical pulses. SKIN:  Without any rashes. MUSCULOSKELETAL:  Negative.  LAB DATA:  Basic  metabolic panel only significant for glucose of 152, otherwise within normal limits.  CBC is within normal limits with white blood cell of 9.9.  Arterial blood gas showed pH 7.38, pCO2 of 37, pO2 of 81, bicarbonate 22, oxygen saturation 96%.  Cannot say if this was done with or without oxygen.  IMAGING:  Chest x-ray.  Impression:  Interval development of focal airspace consolidation in the left mid lung suggesting pneumonia.  ASSESSMENT/AND PLAN: 1. Community-acquired left mid lung pneumonia.  The patient will be     admitted to telemetry bed.  We will obtain urine Legionella and     streptococcal antigen.  The patient has received a dose of IV     Rocephin and Zithromax in the emergency department which will be     continued. 2. Acute exacerbation of asthmatic bronchitis.  We will continue IV     antibiotics as above.  We will continue IV Solu-Medrol,     bronchodilator nebulizations, and oxygen.  When the patient's acute     illness is resolved, she may warrant pulmonary consultation for     repeat pulmonary function testS to consider if she is a  asthmatic     that would require maintenance medications. Also     given her history of sleep apnea and dyspnea on exertion, we will     request a 2-D echocardiogram to check for pulmonary hypertension. 3. Hypoxemia secondary to pneumonia and asthmatic bronchitis     exacerbation.  We will treat with oxygen and bronchodilator     nebulizations as above. 4. Gastroesophageal reflux disease.  This may also be the precipitant     for her nighttime coughing and difficulty breathing from     laryngopharyngeal reflux.  We will treat with a proton pump     inhibitors. 5. Hypertension.  We will temporarily hold triamterene and     hydrochlorothiazide for a day secondary to clinical dehydration and     will resume it tomorrow.  She, however, seems to be using this on a     as needed basis and we will monitor her today without it and see     what her blood pressures do. 6. Obstructive sleep apnea.  The patient has not been compliant with     CPAP and this can be followed as an outpatient. 7. Full code status.  Time taken in coordinating this history and physical note is 1 hour.     Katrina Scott, MD     AH/MEDQ  D:  04/08/2011  T:  04/08/2011  Job:  [id number]  cc:   Katrina Schwalbe, MD  Electronically Signed by Katrina Scott MD on 04/08/2011 03:42:46 PM

## 2011-04-09 LAB — HEMOGLOBIN A1C: Hgb A1c MFr Bld: 5.4 % (ref <5.7)

## 2011-04-09 LAB — COMPREHENSIVE METABOLIC PANEL
ALT: 21 U/L (ref 0–35)
Albumin: 3 g/dL — ABNORMAL LOW (ref 3.5–5.2)
Alkaline Phosphatase: 53 U/L (ref 39–117)
BUN: 16 mg/dL (ref 6–23)
Potassium: 3.9 mEq/L (ref 3.5–5.1)
Sodium: 140 mEq/L (ref 135–145)
Total Protein: 6.9 g/dL (ref 6.0–8.3)

## 2011-04-09 LAB — GLUCOSE, CAPILLARY: Glucose-Capillary: 147 mg/dL — ABNORMAL HIGH (ref 70–99)

## 2011-04-09 LAB — CBC
MCHC: 31.9 g/dL (ref 30.0–36.0)
Platelets: 218 10*3/uL (ref 150–400)
RDW: 13.7 % (ref 11.5–15.5)

## 2011-04-09 MED ORDER — ONDANSETRON HCL 4 MG/2ML IJ SOLN
4.0000 mg | Freq: Four times a day (QID) | INTRAMUSCULAR | Status: DC | PRN
Start: 2011-04-10 — End: 2011-04-11

## 2011-04-09 MED ORDER — PAROXETINE HCL 20 MG PO TABS
20.0000 mg | ORAL_TABLET | Freq: Every day | ORAL | Status: DC
  Administered 2011-04-10 – 2011-04-11 (×2): 20 mg via ORAL
  Filled 2011-04-09 (×2): qty 1

## 2011-04-09 MED ORDER — DEXTROSE 5 % IV SOLN
500.0000 mg | INTRAVENOUS | Status: DC
  Administered 2011-04-10 – 2011-04-11 (×2): 500 mg via INTRAVENOUS
  Filled 2011-04-09 (×2): qty 500

## 2011-04-09 MED ORDER — ZOLPIDEM TARTRATE 5 MG PO TABS: 5.0000 mg | ORAL_TABLET | Freq: Every evening | ORAL | Status: DC | PRN

## 2011-04-09 MED ORDER — ONDANSETRON HCL 4 MG PO TABS: 4.0000 mg | ORAL_TABLET | Freq: Four times a day (QID) | ORAL | Status: DC | PRN

## 2011-04-09 MED ORDER — ALBUTEROL SULFATE (5 MG/ML) 0.5% IN NEBU
2.5000 mg | INHALATION_SOLUTION | RESPIRATORY_TRACT | Status: DC | PRN
  Filled 2011-04-09: qty 0.5

## 2011-04-09 MED ORDER — GUAIFENESIN-DM 100-10 MG/5ML PO SYRP
5.0000 mL | ORAL_SOLUTION | ORAL | Status: DC | PRN
  Filled 2011-04-09: qty 5

## 2011-04-09 MED ORDER — ALBUTEROL SULFATE (5 MG/ML) 0.5% IN NEBU
2.5000 mg | INHALATION_SOLUTION | Freq: Four times a day (QID) | RESPIRATORY_TRACT | Status: DC
  Administered 2011-04-10 – 2011-04-11 (×6): 2.5 mg via RESPIRATORY_TRACT
  Filled 2011-04-09: qty 20
  Filled 2011-04-09: qty 0.5
  Filled 2011-04-09 (×6): qty 20
  Filled 2011-04-09 (×2): qty 0.5
  Filled 2011-04-09: qty 20
  Filled 2011-04-09: qty 0.5
  Filled 2011-04-09: qty 20
  Filled 2011-04-09 (×2): qty 0.5
  Filled 2011-04-09: qty 20

## 2011-04-09 MED ORDER — SENNA 8.6 MG PO TABS
2.0000 | ORAL_TABLET | Freq: Every day | ORAL | Status: DC | PRN
  Filled 2011-04-09: qty 2

## 2011-04-09 MED ORDER — SODIUM CHLORIDE 0.9 % IJ SOLN
3.0000 mL | Freq: Two times a day (BID) | INTRAMUSCULAR | Status: DC
  Administered 2011-04-10 – 2011-04-11 (×3): 3 mL via INTRAVENOUS

## 2011-04-09 MED ORDER — PREDNISONE 50 MG PO TABS
50.0000 mg | ORAL_TABLET | Freq: Every day | ORAL | Status: DC
  Administered 2011-04-10 – 2011-04-11 (×2): 50 mg via ORAL
  Filled 2011-04-09 (×4): qty 1

## 2011-04-09 MED ORDER — LORATADINE 10 MG PO TABS
10.0000 mg | ORAL_TABLET | Freq: Every day | ORAL | Status: DC
  Administered 2011-04-10: 10 mg via ORAL
  Filled 2011-04-09 (×2): qty 1

## 2011-04-09 MED ORDER — ENOXAPARIN SODIUM 40 MG/0.4ML ~~LOC~~ SOLN
40.0000 mg | Freq: Every day | SUBCUTANEOUS | Status: DC
  Administered 2011-04-10 – 2011-04-11 (×2): 40 mg via SUBCUTANEOUS
  Filled 2011-04-09 (×2): qty 0.4

## 2011-04-09 MED ORDER — MONTELUKAST SODIUM 10 MG PO TABS
10.0000 mg | ORAL_TABLET | Freq: Every day | ORAL | Status: DC
  Administered 2011-04-10: 10 mg via ORAL
  Filled 2011-04-09 (×2): qty 1

## 2011-04-09 MED ORDER — POLYETHYLENE GLYCOL 3350 17 G PO PACK
17.0000 g | PACK | Freq: Every day | ORAL | Status: DC | PRN
  Filled 2011-04-09: qty 1

## 2011-04-09 MED ORDER — IPRATROPIUM BROMIDE 0.02 % IN SOLN
0.5000 mg | Freq: Four times a day (QID) | RESPIRATORY_TRACT | Status: DC
  Administered 2011-04-10 – 2011-04-11 (×6): 0.5 mg via RESPIRATORY_TRACT
  Filled 2011-04-09 (×4): qty 2.5

## 2011-04-09 MED ORDER — ACETAMINOPHEN 325 MG PO TABS: 650.0000 mg | ORAL_TABLET | ORAL | Status: DC | PRN

## 2011-04-09 MED ORDER — FUROSEMIDE 40 MG PO TABS
40.0000 mg | ORAL_TABLET | Freq: Every day | ORAL | Status: DC
  Administered 2011-04-09 – 2011-04-11 (×3): 40 mg via ORAL
  Filled 2011-04-09 (×3): qty 1

## 2011-04-09 MED ORDER — INSULIN ASPART 100 UNIT/ML ~~LOC~~ SOLN
0.0000 [IU] | Freq: Three times a day (TID) | SUBCUTANEOUS | Status: DC
  Administered 2011-04-09: 0 [IU] via SUBCUTANEOUS
  Administered 2011-04-09: 1 [IU] via SUBCUTANEOUS
  Filled 2011-04-09: qty 3

## 2011-04-09 MED ORDER — METHYLPREDNISOLONE SODIUM SUCC 125 MG IJ SOLR: 60.0000 mg | Freq: Three times a day (TID) | INTRAMUSCULAR | Status: DC

## 2011-04-09 MED ORDER — DEXTROSE 5 % IV SOLN
1.0000 g | INTRAVENOUS | Status: DC
  Administered 2011-04-10 – 2011-04-11 (×2): 1 g via INTRAVENOUS
  Filled 2011-04-09 (×2): qty 10

## 2011-04-09 MED ORDER — PANTOPRAZOLE SODIUM 40 MG PO TBEC
40.0000 mg | DELAYED_RELEASE_TABLET | Freq: Every day | ORAL | Status: DC
  Administered 2011-04-10 – 2011-04-11 (×2): 40 mg via ORAL

## 2011-04-09 MED ORDER — ROPINIROLE HCL 1 MG PO TABS
1.0000 mg | ORAL_TABLET | Freq: Every day | ORAL | Status: DC
  Administered 2011-04-10: 1 mg via ORAL
  Filled 2011-04-09 (×2): qty 1

## 2011-04-09 MED ORDER — GUAIFENESIN ER 600 MG PO TB12
600.0000 mg | ORAL_TABLET | Freq: Two times a day (BID) | ORAL | Status: DC
  Administered 2011-04-10 – 2011-04-11 (×3): 600 mg via ORAL
  Filled 2011-04-09 (×4): qty 1

## 2011-04-10 DIAGNOSIS — K219 Gastro-esophageal reflux disease without esophagitis: Secondary | ICD-10-CM | POA: Diagnosis present

## 2011-04-10 DIAGNOSIS — I5033 Acute on chronic diastolic (congestive) heart failure: Secondary | ICD-10-CM | POA: Diagnosis not present

## 2011-04-10 DIAGNOSIS — J189 Pneumonia, unspecified organism: Secondary | ICD-10-CM | POA: Diagnosis present

## 2011-04-10 DIAGNOSIS — J45909 Unspecified asthma, uncomplicated: Secondary | ICD-10-CM | POA: Diagnosis present

## 2011-04-10 LAB — GLUCOSE, CAPILLARY
Glucose-Capillary: 174 mg/dL — ABNORMAL HIGH (ref 70–99)
Glucose-Capillary: 166 mg/dL — ABNORMAL HIGH (ref 70–99)
Glucose-Capillary: 145 mg/dL — ABNORMAL HIGH (ref 70–99)
Glucose-Capillary: 119 mg/dL — ABNORMAL HIGH (ref 70–99)

## 2011-04-10 MED ORDER — INSULIN ASPART 100 UNIT/ML ~~LOC~~ SOLN
0.0000 [IU] | Freq: Three times a day (TID) | SUBCUTANEOUS | Status: DC
  Administered 2011-04-10 – 2011-04-11 (×4): 1 [IU] via SUBCUTANEOUS
  Filled 2011-04-10: qty 3

## 2011-04-10 MED ORDER — ALBUTEROL SULFATE (5 MG/ML) 0.5% IN NEBU
INHALATION_SOLUTION | RESPIRATORY_TRACT | Status: AC
  Administered 2011-04-10: 2.5 mg via RESPIRATORY_TRACT
  Filled 2011-04-10: qty 0.5

## 2011-04-10 NOTE — Plan of Care (Signed)
Problem: Phase I Progression Outcomes Goal: Flu/PneumoVaccines if indicated Outcome: Completed/Met Date Met:  04/10/11 Pt refused Flu and PNA vaccinations

## 2011-04-10 NOTE — Progress Notes (Signed)
Subjective: Patient says she continues to feel better. She has minimal dry cough. Her dyspnea on exertion is improving and activity distance is increasing. She denies chest pain.  Review of systems: No fever or chills.  Objective: Blood pressure 120/77, pulse 88, temperature 97.6 F (36.4 C), temperature source Oral, resp. rate 18, height 5\' 2"  (1.575 m), weight 113.14 kg (249 lb 6.9 oz), SpO2 96.00%.  Intake/Output Summary (Last 24 hours) at 04/10/11 1308 Last data filed at 04/10/11 1012  Gross per 24 hour  Intake    490 ml  Output   1800 ml  Net  -1310 ml   Gen. exam: Patient is sitting comfortably on the chair talking to her friend. Respiratory system: Clear. No increased work of breathing Cardiovascular system: First and second heart sounds heard, regular. No JVD. Trace lower extremity edema. Gastrointestinal system: Abdomen nondistended, soft and normal bowel sounds heard. Central nervous system: Patient awake alert oriented and has no focal neurological deficits.  Lab Results: Basic Metabolic Panel:  Basename 04/09/11 1000 04/08/11 0533  NA 140 141  K 3.9 3.5  CL 106 104  CO2 23 24  GLUCOSE 224* 152*  BUN 16 13  CREATININE 0.58 0.73  CALCIUM 9.1 9.0  MG -- --  PHOS -- --   Liver Function Tests:  Basename 04/09/11 1000  AST 17  ALT 21  ALKPHOS 53  BILITOT 0.3  PROT 6.9  ALBUMIN 3.0*   No results found for this basename: LIPASE:2,AMYLASE:2 in the last 72 hours No results found for this basename: AMMONIA:2 in the last 72 hours CBC:  Basename 04/09/11 1000 04/08/11 0533  WBC 13.2* 9.9  NEUTROABS -- 8.7*  HGB 12.3 13.4  HCT 38.6 40.9  MCV 88.5 88.7  PLT 218 238   Cardiac Enzymes: No results found for this basename: CKTOTAL:3,CKMB:3,CKMBINDEX:3,TROPONINI:3 in the last 72 hours BNP:  [facility] 04/08/11 1849  POCBNP 1956.0*   D-Dimer: No results found for this basename: DDIMER:2 in the last 72 hours CBG:  Basename 04/09/11 1205 04/09/11 0803 04/08/11  2135 04/08/11 1634 04/08/11 1146  GLUCAP 147* 147* 158* 171* 188*   Hemoglobin A1C:  Basename 04/09/11 1000  HGBA1C 5.4   Fasting Lipid Panel: No results found for this basename: CHOL,HDL,LDLCALC,TRIG,CHOLHDL,LDLDIRECT in the last 72 hours Thyroid Function Tests: No results found for this basename: TSH,T4TOTAL,FREET4,T3FREE,THYROIDAB in the last 72 hours Anemia Panel: No results found for this basename: VITAMINB12,FOLATE,FERRITIN,TIBC,IRON,RETICCTPCT in the last 72 hours Coagulation: No results found for this basename: INR:2 in the last 72 hours Urine Drug Screen:  Alcohol Level: No results found for this basename: ETH:2 in the last 72 hours Urinalysis:  Misc. Labs:   Micro Results: No results found for this or any previous visit (from the past 240 hour(s)).  Studies/Results: Dg Chest Portable 1 View  04/08/2011  *RADIOLOGY REPORT*  Clinical Data: Shortness of breath  PORTABLE CHEST - 1 VIEW  Comparison: 01/20/2011, [facility]  Findings: Shallow inspiration.  Interval development of focal airspace infiltration in the left mid lung suggesting pneumonia. No blunting of costophrenic angles.  No pneumothorax.  Normal heart size and pulmonary vascularity.  IMPRESSION: Interval development of focal airspace consolidation in the left mid lung suggesting pneumonia.  Original Report Authenticated By: Marlon Pel, M.D.    Medications: Scheduled Meds:   . albuterol  2.5 mg Nebulization Q6H  . azithromycin  500 mg Intravenous Q24H  . cefTRIAXone (ROCEPHIN) IV  1 g Intravenous Q24H  . enoxaparin (LOVENOX) injection  40 mg  Subcutaneous Daily  . furosemide  40 mg Oral Daily  . guaiFENesin  600 mg Oral BID  . insulin aspart  0-9 Units Subcutaneous TID WC  . ipratropium  0.5 mg Nebulization Q6H  . loratadine  10 mg Oral QHS  . montelukast  10 mg Oral QHS  . pantoprazole  40 mg Oral Q1200  . PARoxetine  20 mg Oral Daily  . predniSONE  50 mg Oral QAC breakfast  .  rOPINIRole  1 mg Oral QHS  . sodium chloride  3 mL Intravenous Q12H  . DISCONTD: insulin aspart  0-9 Units Subcutaneous TID WC  . DISCONTD: methylPREDNISolone (SOLU-MEDROL) injection  60 mg Intravenous Q8H   Continuous Infusions:  PRN Meds:.acetaminophen, albuterol, guaiFENesin-dextromethorphan, ondansetron, ondansetron, polyethylene glycol, senna, zolpidem  Assessment/Plan: Patient Active Hospital Problem List: No active hospital problems. 1. Community acquired left mid lung pneumonia: Improving. Patient is on day #3 of intravenous Rocephin and Zithromax. 2. Possible acute on chronic diastolic congestive heart failure: Resumed Lasix yesterday. Patient is diuresing well. 3. Possible acute exacerbation of asthmatic bronchitis: Continue antibiotics as above and prednisone taper. Consider outpatient pulmonary consultation for further evaluation. 4. Gastroesophageal reflux disease: Continue protonix. 5. Hypertension: Controlled. 6. Obstructive sleep apnea: Patient has stopped using her CPAP approximately 2 years ago. Recommend outpatient followup with pulmonology. 7. Hyperglycemia secondary to steroids: Continue sliding scale insulin 8. Disposition: Monitor patient for an additional day on IV antibiotics and consider discharge home on fifth of November.   Katrina Andrews 04/10/2011, 1:08 PM

## 2011-04-10 NOTE — Plan of Care (Signed)
Problem: ICU Phase Progression Outcomes Goal: Initial discharge plan identified Outcome: Completed/Met Date Met:  04/10/11 Pt to return home

## 2011-04-11 LAB — BASIC METABOLIC PANEL
Calcium: 9.1 mg/dL (ref 8.4–10.5)
GFR calc Af Amer: >90 mL/min (ref >90)
GFR calc non Af Amer: 89 mL/min — ABNORMAL LOW (ref >90)
Potassium: 3 mEq/L — ABNORMAL LOW (ref 3.5–5.1)
Sodium: 140 mEq/L (ref 135–145)

## 2011-04-11 LAB — GLUCOSE, CAPILLARY: Glucose-Capillary: 95 mg/dL (ref 70–99)

## 2011-04-11 MED ORDER — POTASSIUM CHLORIDE CRYS ER 20 MEQ PO TBCR
40.0000 meq | EXTENDED_RELEASE_TABLET | Freq: Once | ORAL | Status: AC
  Administered 2011-04-11: 40 meq via ORAL
  Filled 2011-04-11: qty 2

## 2011-04-11 MED ORDER — PANTOPRAZOLE SODIUM 40 MG PO TBEC: 40.0000 mg | DELAYED_RELEASE_TABLET | Freq: Every day | ORAL | Status: DC

## 2011-04-11 MED ORDER — POTASSIUM CHLORIDE ER 10 MEQ PO TBCR: 20.0000 meq | EXTENDED_RELEASE_TABLET | Freq: Every day | ORAL | Status: DC

## 2011-04-11 MED ORDER — MOXIFLOXACIN HCL 400 MG PO TABS: 400.0000 mg | ORAL_TABLET | Freq: Every day | ORAL | Status: DC

## 2011-04-11 MED ORDER — FUROSEMIDE 40 MG PO TABS: 40.0000 mg | ORAL_TABLET | Freq: Every day | ORAL | Status: DC

## 2011-04-11 MED ORDER — PREDNISONE 10 MG PO TABS: ORAL_TABLET | ORAL | Status: DC

## 2011-04-11 NOTE — Progress Notes (Signed)
IV removed, site unremarkable.  Discharge teaching, follow-up appointment, and prescriptions given. All belongings sent with pt and all questions answered. Pt transported in wheelchair by volunteer and traveled home by private vehicle with family member.

## 2011-04-11 NOTE — Discharge Summary (Signed)
DISCHARGE SUMMARY  Katrina Andrews  MR#: [id number]  DOB:09/27/1945   Date of Admission: 04/08/2011 Date of Discharge: 04/11/2011  Attending Physician:Peggie Hornak  Patient's VHQ:[id number] Katrina Sias, MD, MD  Consults:  none  Discharge Diagnoses: Present on Admission:  .PNA (pneumonia) .Acute asthmatic bronchitis .GERD (gastroesophageal reflux disease)    Current Discharge Medication List    START taking these medications   Details  furosemide (LASIX) 40 MG tablet Take 1 tablet (40 mg total) by mouth daily. Qty: 30 tablet, Refills: 0    moxifloxacin (AVELOX) 400 MG tablet Take 1 tablet (400 mg total) by mouth daily. Qty: 3 tablet, Refills: 0    pantoprazole (PROTONIX) 40 MG tablet Take 1 tablet (40 mg total) by mouth daily at 12 noon. Qty: 30 tablet, Refills: 0    potassium chloride (K-DUR) 10 MEQ tablet Take 2 tablets (20 mEq total) by mouth daily. Qty: 30 tablet, Refills: 0    predniSONE (DELTASONE) 10 MG tablet Take 4 tablets by mouth daily for 2 days, then 3 tablets daily for 2 days, then 2 tablets daily for 2 days, then 1 tablet daily for 2 days, the stop. Qty: 20 tablet, Refills: 0      CONTINUE these medications which have NOT CHANGED   Details  acetaminophen (TYLENOL ARTHRITIS PAIN) 650 MG CR tablet Take 1,300 mg by mouth every morning.     albuterol (VENTOLIN HFA) 108 (90 BASE) MCG/ACT inhaler Inhale 2 puffs into the lungs every 6 (six) hours as needed for wheezing. Qty: 1 Inhaler, Refills: 1    cetirizine (ZYRTEC) 10 MG tablet Take 10 mg by mouth at bedtime.      ketoconazole (NIZORAL) 2 % cream Apply 1 application topically daily as needed. For dry skin     Melatonin 5 MG CAPS Take 5 mg by mouth at bedtime as needed. For sleep    montelukast (SINGULAIR) 10 MG tablet Take 1 tablet (10 mg total) by mouth at bedtime. Qty: 30 tablet, Refills: 11    Naproxen Sodium (ALEVE) 220 MG CAPS Take 220 mg by mouth every 8 (eight) hours as needed. For pain    OVER THE  COUNTER MEDICATION Take 1 tablet by mouth daily as needed. FIBER TABLET (OTC) for constipation     PARoxetine (PAXIL) 20 MG tablet Take 20 mg by mouth every morning. Take one by mouth daily     rOPINIRole (REQUIP) 1 MG tablet Take 1 mg by mouth at bedtime.        STOP taking these medications     triamterene-hydrochlorothiazide (DYAZIDE) 37.5-25 MG per capsule           Hospital Course: Present on Admission:  .PNA (pneumonia) .Acute asthmatic bronchitis .GERD (gastroesophageal reflux disease):  Patient has history of hypertension, asthmatic bronchitis, restless leg syndrome, obstructive sleep apnea who was well and she voluntarily discontinued CPAP approximately 2 years ago who has been having dyspnea mostly on exertion for 3-5 months prior to admission. She saw her primary care physician couple of times in the last few weeks for dyspnea and cough. She presented to the hospital with worsening cough and dyspnea. She does have significant symptoms of gastroesophageal reflux. EMS found her oxygen saturations to be 89%. Chest x-ray on admission revealed focal airspace consolidation in the left mid lung suggesting pneumonia. Patient was admitted and empirically treated with IV Rocephin and azithromycin. She was also provided IV Solu-Medrol , Bronchodilator nebulization's and oxygen. Subsequently her BNP came back elevated suggesting some degree of acute on chronic  diastolic congestive heart failure. Lasix was started. With these measures patient has done quite well. She indicates that her breathing is hugely improved. Her dyspnea on exertion is minimal. She has ambulated the halls without any distress. It appears as though the pneumonia and decompensated congestive heart failure and gastroesophageal reflux disease caused acute asthmatic bronchitis. She is clinically improved.  Hypokalemia: Repleted prior to discharge  Obstructive sleep apnea: Recommend outpatient reevaluation by  pulmonology.  Hyperglycemia without diagnosis of diabetes  Hypertension: Controlled   Day of Discharge BP 119/72  Pulse 77  Temp(Src) 97.7 F (36.5 C) (Oral)  Resp 18  Ht 5\' 2"  (1.575 m)  Wt 113.14 kg (249 lb 6.9 oz)  BMI 45.62 kg/m2  SpO2 91%  Physical Exam: Gen. exam: Patient is sitting comfortably eating breakfast.  Respiratory system: Clear. No increased work of breathing  Cardiovascular system: First and second heart sounds heard, regular. No JVD. Trace lower extremity edema.  Gastrointestinal system: Abdomen nondistended, soft and normal bowel sounds heard.  Central nervous system: Patient awake alert oriented and has no focal neurological deficits   Results for orders placed during the hospital encounter of 04/08/11 (from the past 24 hour(s))  GLUCOSE, CAPILLARY     Status: Abnormal   Collection Time   04/10/11  1:00 PM      Component Value Range   Glucose-Capillary 145 (*) 70 - 99 (mg/dL)  GLUCOSE, CAPILLARY     Status: Abnormal   Collection Time   04/10/11  5:28 PM      Component Value Range   Glucose-Capillary 132 (*) 70 - 99 (mg/dL)  GLUCOSE, CAPILLARY     Status: Abnormal   Collection Time   04/10/11  9:23 PM      Component Value Range   Glucose-Capillary 119 (*) 70 - 99 (mg/dL)  BASIC METABOLIC PANEL     Status: Abnormal   Collection Time   04/11/11  7:00 AM      Component Value Range   Sodium 140  135 - 145 (mEq/L)   Potassium 3.0 (*) 3.5 - 5.1 (mEq/L)   Chloride 101  96 - 112 (mEq/L)   CO2 29  19 - 32 (mEq/L)   Glucose, Bld 98  70 - 99 (mg/dL)   BUN 21  6 - 23 (mg/dL)   Creatinine, Ser 7.82  0.50 - 1.10 (mg/dL)   Calcium 9.1  8.4 - 95.6 (mg/dL)   GFR calc non Af Amer 89 (*) >90 (mL/min)   GFR calc Af Amer >90  >90 (mL/min)  GLUCOSE, CAPILLARY     Status: Normal   Collection Time   04/11/11  7:59 AM      Component Value Range   Glucose-Capillary 95  70 - 99 (mg/dL)  GLUCOSE, CAPILLARY     Status: Abnormal   Collection Time   04/11/11 12:03 PM       Component Value Range   Glucose-Capillary 136 (*) 70 - 99 (mg/dL)    Disposition: Patient has improved and is discharged home in stable condition.   Follow-up Appts: Discharge Orders    Future Orders Please Complete By Expires   Diet - low sodium heart healthy      Activity as tolerated - No restrictions      Call MD for:  difficulty breathing, headache or visual disturbances      (HEART FAILURE PATIENTS) Call MD:  Anytime you have any of the following symptoms: 1) 3 pound weight gain in 24 hours or 5 pounds  in 1 week 2) shortness of breath, with or without a dry hacking cough 3) swelling in the hands, feet or stomach 4) if you have to sleep on extra pillows at night in order to breathe.         Follow-up with Dr. Tillman Abide, in 4-5 days from hospital discharge.   Tests Needing Follow-up: BMP Recommend repeating chest x-ray in a couple of weeks from discharge to insure resolution of admission findings.  SignedMarcellus Scott 04/11/2011, 12:56 PM

## 2011-04-15 ENCOUNTER — Ambulatory Visit (INDEPENDENT_AMBULATORY_CARE_PROVIDER_SITE_OTHER): Payer: 59 | Admitting: Internal Medicine

## 2011-04-15 ENCOUNTER — Encounter: Payer: Self-pay | Admitting: Internal Medicine

## 2011-04-15 VITALS — BP 126/67 | HR 79 | Temp 98.6°F | Resp 12 | Ht 61.0 in | Wt 237.0 lb

## 2011-04-15 DIAGNOSIS — I509 Heart failure, unspecified: Secondary | ICD-10-CM

## 2011-04-15 DIAGNOSIS — I5033 Acute on chronic diastolic (congestive) heart failure: Secondary | ICD-10-CM

## 2011-04-15 DIAGNOSIS — J189 Pneumonia, unspecified organism: Secondary | ICD-10-CM

## 2011-04-15 DIAGNOSIS — Z23 Encounter for immunization: Secondary | ICD-10-CM

## 2011-04-15 DIAGNOSIS — K219 Gastro-esophageal reflux disease without esophagitis: Secondary | ICD-10-CM

## 2011-04-15 LAB — BASIC METABOLIC PANEL
BUN: 16 mg/dL (ref 6–23)
Creatinine, Ser: 0.9 mg/dL (ref 0.4–1.2)
GFR: 63.89 mL/min (ref >60.00)
Glucose, Bld: 153 mg/dL — ABNORMAL HIGH (ref 70–99)

## 2011-04-15 LAB — BRAIN NATRIURETIC PEPTIDE: Pro B Natriuretic peptide (BNP): 10 pg/mL (ref 0.0–100.0)

## 2011-04-15 NOTE — Patient Instructions (Signed)
If you are not having any stomach troubles or heartburn, you can stop the pantoprazole in 1-2 weeks

## 2011-04-15 NOTE — Progress Notes (Signed)
Subjective:    Patient ID: Katrina Andrews, female    DOB: Feb 15, 1946, 65 y.o.   MRN: [id number]  HPI Admitted with pneumonia Finished antibiotic yesterday Lasix increased to 40mg  daily  Potassium supplements increased (hadn't really been taking) Has a few more days of the prednisone  On PPI due to secondary reflux due to cough Cough is mostly better Breathing is good No fever  Has some cramping around back and abdomen at times Mostly when standing up Plans to get adjustment and massage  Current Outpatient Prescriptions on File Prior to Visit  Medication Sig Dispense Refill  . albuterol (VENTOLIN HFA) 108 (90 BASE) MCG/ACT inhaler Inhale 2 puffs into the lungs every 6 (six) hours as needed for wheezing.  1 Inhaler  1  . cetirizine (ZYRTEC) 10 MG tablet Take 10 mg by mouth at bedtime.        . furosemide (LASIX) 40 MG tablet Take 1 tablet (40 mg total) by mouth daily.  30 tablet  0  . ketoconazole (NIZORAL) 2 % cream Apply 1 application topically daily as needed. For dry skin       . Melatonin 5 MG CAPS Take 5 mg by mouth at bedtime as needed. For sleep      . montelukast (SINGULAIR) 10 MG tablet Take 1 tablet (10 mg total) by mouth at bedtime.  30 tablet  11  . Naproxen Sodium (ALEVE) 220 MG CAPS Take 220 mg by mouth every 8 (eight) hours as needed. For pain      . pantoprazole (PROTONIX) 40 MG tablet Take 1 tablet (40 mg total) by mouth daily at 12 noon.  30 tablet  0  . PARoxetine (PAXIL) 20 MG tablet Take 20 mg by mouth every morning. Take one by mouth daily       . potassium chloride (K-DUR) 10 MEQ tablet Take 2 tablets (20 mEq total) by mouth daily.  30 tablet  0  . rOPINIRole (REQUIP) 1 MG tablet Take 1 mg by mouth at bedtime.          No Known Allergies  Past Medical History  Diagnosis Date  . Allergy   . Depression   . Diverticulitis     colon  . Hyperlipidemia   . Hypertension   . Ovarian cyst   . Urinary incontinence     Past Surgical History  Procedure Date    . Cholecystectomy   . Abdominal hysterectomy   . Incontinence surgery 03/2005  . Ankle fracture surgery 1983    left, screw  . Vaginal delivery     x3  . Knee cartilage surgery 2000    torn, left knee no surgery  . Dobutamine stress echo 09/1995  . Laparoscopic gastric banding 09/2007  . Carpal tunnel release 1/12    Dr Hyacinth Meeker    Family History  Problem Relation Age of Onset  . COPD Mother   . Bone cancer Father   . Testicular cancer Brother   . Parkinsonism Paternal Aunt   . Diabetes Neg Hx     History   Social History  . Marital Status: Legally Separated    Spouse Name: N/A    Number of Children: 3  . Years of Education: N/A   Occupational History  . retired Engineer, civil (consulting)   . 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 part-time at [employer]    Social History Main Topics  . Smoking status: Former Smoker    Types: Cigarettes    Quit  date: 06/06/1984  . Smokeless tobacco: Never Used  . Alcohol Use: No  . Drug Use: No  . Sexually Active: Not on file   Other Topics Concern  . Not on file   Social History Narrative  . No narrative on file   Review of Systems Appetite is still not back Sleeping okay in general    Objective:   Physical Exam  Constitutional: She appears well-developed and well-nourished. No distress.  Neck: Normal range of motion. Neck supple. No thyromegaly present.  Cardiovascular: Normal rate, regular rhythm and normal heart sounds.  Exam reveals no gallop.   No murmur heard. Pulmonary/Chest: Effort normal and breath sounds normal. No respiratory distress. She has no wheezes. She has no rales.  Musculoskeletal: She exhibits no edema and no tenderness.  Lymphadenopathy:    She has no cervical adenopathy.  Psychiatric: She has a normal mood and affect. Her behavior is normal. Judgment and thought content normal.          Assessment & Plan:

## 2011-04-15 NOTE — Assessment & Plan Note (Signed)
Clinically better Finish the prednisone Recheck 1 month with follow up CXR

## 2011-04-15 NOTE — Assessment & Plan Note (Signed)
Diagnosis not clear cut BNP was up some Echo looked fine Will recheck labs today

## 2011-04-15 NOTE — Assessment & Plan Note (Signed)
May have been mostly secondary to cough Will stop PPI in a couple of weeks if doing well

## 2011-04-26 ENCOUNTER — Telehealth: Payer: Self-pay | Admitting: *Deleted

## 2011-04-26 NOTE — Telephone Encounter (Signed)
Advised pt of lab results from 11/9.

## 2011-05-02 ENCOUNTER — Ambulatory Visit (INDEPENDENT_AMBULATORY_CARE_PROVIDER_SITE_OTHER): Payer: 59 | Admitting: Internal Medicine

## 2011-05-02 ENCOUNTER — Encounter: Payer: Self-pay | Admitting: Internal Medicine

## 2011-05-02 VITALS — BP 133/69 | HR 93 | Temp 97.7°F | Ht 61.0 in | Wt 247.0 lb

## 2011-05-02 DIAGNOSIS — I509 Heart failure, unspecified: Secondary | ICD-10-CM

## 2011-05-02 DIAGNOSIS — J189 Pneumonia, unspecified organism: Secondary | ICD-10-CM

## 2011-05-02 DIAGNOSIS — I5032 Chronic diastolic (congestive) heart failure: Secondary | ICD-10-CM | POA: Insufficient documentation

## 2011-05-02 DIAGNOSIS — R05 Cough: Secondary | ICD-10-CM | POA: Insufficient documentation

## 2011-05-02 DIAGNOSIS — R059 Cough, unspecified: Secondary | ICD-10-CM

## 2011-05-02 MED ORDER — LEVOFLOXACIN 500 MG PO TABS: 500.0000 mg | ORAL_TABLET | Freq: Every day | ORAL | Status: AC

## 2011-05-02 MED ORDER — FUROSEMIDE 40 MG PO TABS: 40.0000 mg | ORAL_TABLET | Freq: Two times a day (BID) | ORAL | Status: DC

## 2011-05-02 MED ORDER — POTASSIUM CHLORIDE ER 10 MEQ PO TBCR: 20.0000 meq | EXTENDED_RELEASE_TABLET | Freq: Every day | ORAL | Status: DC

## 2011-05-02 NOTE — Assessment & Plan Note (Signed)
Had seemed to be cleared clincially Now with cough and low grade fever Will restart antibiotic Wonders about aspiration due to past lap band--not clear this is an issue

## 2011-05-02 NOTE — Assessment & Plan Note (Signed)
Ongoing cough again for about 4 days Low grade fever ??recurrence of pneumonia ??fluid related---weight up 10# PE unremarkable

## 2011-05-02 NOTE — Assessment & Plan Note (Signed)
Weight is up  Need to presume that cough and dyspnea could be due to fluid Will increase furosemide and recheck next week

## 2011-05-02 NOTE — Progress Notes (Signed)
Subjective:    Patient ID: Katrina Andrews, female    DOB: 05-Oct-1945, 65 y.o.   MRN: [id number]  HPI Has had cough again--mostly at night Some shortness of breath---noted first doing routine housework and with just slight walking Low grade temperature--over the past 3 days. 99.8-100 when 97 is normal for her Tylenol helped Cough is dry  Some help from inhaler  Has been voiding Uses the lasix daily--does have brisk diuresis with it Voids but doesn't see weight coming done  Weight at home 245-247# Had been a little less after the hospital  Current Outpatient Prescriptions on File Prior to Visit  Medication Sig Dispense Refill  . albuterol (VENTOLIN HFA) 108 (90 BASE) MCG/ACT inhaler Inhale 2 puffs into the lungs every 6 (six) hours as needed for wheezing.  1 Inhaler  1  . cetirizine (ZYRTEC) 10 MG tablet Take 10 mg by mouth at bedtime.        . furosemide (LASIX) 40 MG tablet Take 1 tablet (40 mg total) by mouth daily.  30 tablet  0  . ketoconazole (NIZORAL) 2 % cream Apply 1 application topically daily as needed. For dry skin       . Melatonin 5 MG CAPS Take 5 mg by mouth at bedtime as needed. For sleep      . montelukast (SINGULAIR) 10 MG tablet Take 1 tablet (10 mg total) by mouth at bedtime.  30 tablet  11  . Naproxen Sodium (ALEVE) 220 MG CAPS Take 220 mg by mouth every 8 (eight) hours as needed. For pain      . pantoprazole (PROTONIX) 40 MG tablet Take 1 tablet (40 mg total) by mouth daily at 12 noon.  30 tablet  0  . PARoxetine (PAXIL) 20 MG tablet Take 20 mg by mouth every morning. Take one by mouth daily       . potassium chloride (K-DUR) 10 MEQ tablet Take 2 tablets (20 mEq total) by mouth daily.  30 tablet  0  . rOPINIRole (REQUIP) 1 MG tablet Take 1 mg by mouth at bedtime.          No Known Allergies  Past Medical History  Diagnosis Date  . Allergy   . Depression   . Diverticulitis     colon  . Hyperlipidemia   . Hypertension   . Ovarian cyst   . Urinary  incontinence     Past Surgical History  Procedure Date  . Cholecystectomy   . Abdominal hysterectomy   . Incontinence surgery 03/2005  . Ankle fracture surgery 1983    left, screw  . Vaginal delivery     x3  . Knee cartilage surgery 2000    torn, left knee no surgery  . Dobutamine stress echo 09/1995  . Laparoscopic gastric banding 09/2007  . Carpal tunnel release 1/12    Dr Hyacinth Meeker    Family History  Problem Relation Age of Onset  . COPD Mother   . Bone cancer Father   . Testicular cancer Brother   . Parkinsonism Paternal Aunt   . Diabetes Neg Hx     History   Social History  . Marital Status: Legally Separated    Spouse Name: N/A    Number of Children: 3  . Years of Education: N/A   Occupational History  . retired Engineer, civil (consulting)   . 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 part-time at [employer]    Social History Main Topics  . Smoking status: Former Smoker  Types: Cigarettes    Quit date: 06/06/1984  . Smokeless tobacco: Never Used  . Alcohol Use: No  . Drug Use: No  . Sexually Active: Not on file   Other Topics Concern  . Not on file   Social History Narrative  . No narrative on file   Review of Systems Feels tired No nausea or vomiting Eats okay Some sweats at night---also occ in day (like cooking for Thanksgiving)    Objective:   Physical Exam  Constitutional: She appears well-developed and well-nourished. No distress.  Neck: Normal range of motion. Neck supple. No thyromegaly present.  Cardiovascular: Normal rate, regular rhythm and normal heart sounds.  Exam reveals no gallop.   No murmur heard. Pulmonary/Chest: Effort normal and breath sounds normal. No respiratory distress. She has no wheezes. She has no rales.       No dullness  Musculoskeletal:       Calves are thick without pitting  Lymphadenopathy:    She has no cervical adenopathy.          Assessment & Plan:

## 2011-05-02 NOTE — Patient Instructions (Addendum)
Please start the antibiotic Take the furosemide two times daily until your weight is back down under 238# at home. Take the second in the early afternoon

## 2011-05-16 ENCOUNTER — Encounter: Payer: Self-pay | Admitting: Internal Medicine

## 2011-05-16 ENCOUNTER — Ambulatory Visit (INDEPENDENT_AMBULATORY_CARE_PROVIDER_SITE_OTHER): Payer: 59 | Admitting: Internal Medicine

## 2011-05-16 VITALS — BP 126/75 | HR 100 | Temp 98.0°F | Ht 61.0 in | Wt 246.0 lb

## 2011-05-16 DIAGNOSIS — R059 Cough, unspecified: Secondary | ICD-10-CM

## 2011-05-16 DIAGNOSIS — M069 Rheumatoid arthritis, unspecified: Secondary | ICD-10-CM

## 2011-05-16 DIAGNOSIS — I10 Essential (primary) hypertension: Secondary | ICD-10-CM

## 2011-05-16 DIAGNOSIS — I5032 Chronic diastolic (congestive) heart failure: Secondary | ICD-10-CM

## 2011-05-16 DIAGNOSIS — R05 Cough: Secondary | ICD-10-CM

## 2011-05-16 DIAGNOSIS — I509 Heart failure, unspecified: Secondary | ICD-10-CM

## 2011-05-16 NOTE — Assessment & Plan Note (Signed)
Weight not down Needs to try the lasix as 80 daily, not 40 bid Consider ARB

## 2011-05-16 NOTE — Progress Notes (Signed)
Subjective:    Patient ID: Katrina Andrews, female    DOB: 04-08-1946, 65 y.o.   MRN: [id number]  HPI Feels some better Cough persists but doesn't feel sick now Still has some hand pain--feels tight Still with same swelling in legs Had been taking the lasix 40 bid Does take the 80 together if not working  Not as SOB with activity No fever  Still has sleep problems---does okay with melatonin or advil PM  Ongoing hand pain Naproxen or ibuprofen do give some relief She understands this could be related to the fluid retention  Current Outpatient Prescriptions on File Prior to Visit  Medication Sig Dispense Refill  . albuterol (VENTOLIN HFA) 108 (90 BASE) MCG/ACT inhaler Inhale 2 puffs into the lungs every 6 (six) hours as needed for wheezing.  1 Inhaler  1  . cetirizine (ZYRTEC) 10 MG tablet Take 10 mg by mouth at bedtime.        . furosemide (LASIX) 40 MG tablet Take 1 tablet (40 mg total) by mouth 2 (two) times daily. Take second one if home weight above 238#  60 tablet  3  . ketoconazole (NIZORAL) 2 % cream Apply 1 application topically daily as needed. For dry skin       . Melatonin 5 MG CAPS Take 5 mg by mouth at bedtime as needed. For sleep      . montelukast (SINGULAIR) 10 MG tablet Take 1 tablet (10 mg total) by mouth at bedtime.  30 tablet  11  . Naproxen Sodium (ALEVE) 220 MG CAPS Take 220 mg by mouth every 8 (eight) hours as needed. For pain      . PARoxetine (PAXIL) 20 MG tablet Take 20 mg by mouth every morning. Take one by mouth daily       . potassium chloride (K-DUR) 10 MEQ tablet Take 2 tablets (20 mEq total) by mouth daily.  60 tablet  3  . rOPINIRole (REQUIP) 1 MG tablet Take 1 mg by mouth at bedtime.          No Known Allergies  Past Medical History  Diagnosis Date  . Allergy   . Depression   . Diverticulitis     colon  . Hyperlipidemia   . Hypertension   . Ovarian cyst   . Urinary incontinence     Past Surgical History  Procedure Date  .  Cholecystectomy   . Abdominal hysterectomy   . Incontinence surgery 03/2005  . Ankle fracture surgery 1983    left, screw  . Vaginal delivery     x3  . Knee cartilage surgery 2000    torn, left knee no surgery  . Dobutamine stress echo 09/1995  . Laparoscopic gastric banding 09/2007  . Carpal tunnel release 1/12    Dr Hyacinth Meeker    Family History  Problem Relation Age of Onset  . COPD Mother   . Bone cancer Father   . Testicular cancer Brother   . Parkinsonism Paternal Aunt   . Diabetes Neg Hx     History   Social History  . Marital Status: Legally Separated    Spouse Name: N/A    Number of Children: 3  . Years of Education: N/A   Occupational History  . retired Engineer, civil (consulting)   . 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 part-time at [employer]    Social History Main Topics  . Smoking status: Former Smoker    Types: Cigarettes    Quit date: 06/06/1984  .  Smokeless tobacco: Never Used  . Alcohol Use: No  . Drug Use: No  . Sexually Active: Not on file   Other Topics Concern  . Not on file   Social History Narrative  . No narrative on file   Review of Systems Perspires easily with any activity--mostly in head Appetite is very low    Objective:   Physical Exam  Constitutional: She appears well-developed and well-nourished. No distress.  Neck: Normal range of motion. Neck supple.  Cardiovascular: Normal rate, regular rhythm and normal heart sounds.  Exam reveals no gallop.   No murmur heard. Pulmonary/Chest: Effort normal and breath sounds normal. No respiratory distress. She has no wheezes. She has no rales.  Musculoskeletal: She exhibits edema. She exhibits no tenderness.       2+ non pitting calf edema  Lymphadenopathy:    She has no cervical adenopathy.  Psychiatric: She has a normal mood and affect. Her behavior is normal. Judgment and thought content normal.          Assessment & Plan:

## 2011-05-16 NOTE — Assessment & Plan Note (Signed)
No evidence of infection now Continue current Rx

## 2011-05-16 NOTE — Assessment & Plan Note (Signed)
Uses NSAIDs regularly likely has concommitant osteoarthritis as well

## 2011-05-16 NOTE — Assessment & Plan Note (Signed)
BP Readings from Last 3 Encounters:  05/16/11 126/75  05/02/11 133/69  04/15/11 126/67   BP has been fine No changes

## 2011-05-17 LAB — BASIC METABOLIC PANEL
BUN: 18 mg/dL (ref 6–23)
CO2: 33 mEq/L — ABNORMAL HIGH (ref 19–32)
Calcium: 9.2 mg/dL (ref 8.4–10.5)
Chloride: 102 mEq/L (ref 96–112)
Creatinine, Ser: 1.1 mg/dL (ref 0.4–1.2)

## 2011-06-16 ENCOUNTER — Ambulatory Visit (INDEPENDENT_AMBULATORY_CARE_PROVIDER_SITE_OTHER)
Admission: RE | Admit: 2011-06-16 | Discharge: 2011-06-16 | Disposition: A | Discharge status: Home / Self Care | Payer: 59 | Source: Ambulatory Visit | Attending: Family Medicine | Admitting: Family Medicine

## 2011-06-16 ENCOUNTER — Ambulatory Visit (INDEPENDENT_AMBULATORY_CARE_PROVIDER_SITE_OTHER): Payer: 59 | Admitting: Family Medicine

## 2011-06-16 ENCOUNTER — Encounter: Payer: Self-pay | Admitting: Family Medicine

## 2011-06-16 ENCOUNTER — Telehealth: Payer: Self-pay | Admitting: *Deleted

## 2011-06-16 DIAGNOSIS — I5032 Chronic diastolic (congestive) heart failure: Secondary | ICD-10-CM

## 2011-06-16 DIAGNOSIS — R509 Fever, unspecified: Secondary | ICD-10-CM

## 2011-06-16 DIAGNOSIS — R059 Cough, unspecified: Secondary | ICD-10-CM

## 2011-06-16 DIAGNOSIS — R0602 Shortness of breath: Secondary | ICD-10-CM

## 2011-06-16 DIAGNOSIS — R05 Cough: Secondary | ICD-10-CM

## 2011-06-16 DIAGNOSIS — I509 Heart failure, unspecified: Secondary | ICD-10-CM

## 2011-06-16 MED ORDER — OSELTAMIVIR PHOSPHATE 75 MG PO CAPS: 75.0000 mg | ORAL_CAPSULE | Freq: Two times a day (BID) | ORAL | Status: AC

## 2011-06-16 NOTE — Assessment & Plan Note (Addendum)
Current acute recurrence of cough along with SOB, body ache, fever 100-101, cough... worrisome for influenza.  With recent pneumonia.Marland Kitchen CXR was performed today.Marland Kitchenappears no clear new consolidation.. Will await radiologist review.  Will treat with tamiflu x 5 days. Tylenol for fever and mucinex DM for cough.  Follow up if not improving as expected per flu timeline as reviewed with pt.

## 2011-06-16 NOTE — Progress Notes (Signed)
  Subjective:    Patient ID: Katrina Andrews, female    DOB: 01-Jun-1946, 66 y.o.   MRN: [id number]  HPI  65 year old female patient of Dr. Vassie Moselle with history of diastolic heart failure, sleep apnea presents with  cough productive in  Past 2 days, fever/chills (101 F) . Noted rigors this morning. Body ache and pains but did fall (slipped 2 days ago). Very thirsty, minimal po intake due to poor appetitie. Intermitant nausea. Shortness of breath with exertion , significant worsening in last 2-3 days. She did use albuterol inhaler this morning which helped some with SOB.   Pt admitted for PNA in Nov 2012. Most recent OV notes reviewed in detail. Per last Dr. [name] note 1 month ago... Pt was feeling better but continued to have cough, no sign of infection, some improvement in shortness of breath. She also has history of cough secondary to reflux. Using lasix 40-80 mg depending on fluid status. Took lasix today and urinated 3 lbs today.  Remote smoking history.  Got flu shot Per pt PFTs did not show asthma.  Review of Systems  Constitutional: Negative for fever and fatigue.  HENT: Negative for ear pain.   Eyes: Negative for pain.  Respiratory: Positive for cough and shortness of breath. Negative for chest tightness.   Cardiovascular: Negative for chest pain and palpitations.  Gastrointestinal: Negative for abdominal pain.  Genitourinary: Negative for dysuria.       Objective:   Physical Exam  Constitutional: Vital signs are normal. She appears well-developed and well-nourished. She is cooperative.  Non-toxic appearance. She does not appear ill. No distress.       Morbidly obese female in NAD  HENT:  Head: Normocephalic.  Right Ear: Hearing, tympanic membrane, external ear and ear canal normal. Tympanic membrane is not erythematous, not retracted and not bulging.  Left Ear: Hearing, tympanic membrane, external ear and ear canal normal. Tympanic membrane is not erythematous, not  retracted and not bulging.  Nose: Mucosal edema and rhinorrhea present. Right sinus exhibits no maxillary sinus tenderness and no frontal sinus tenderness. Left sinus exhibits no maxillary sinus tenderness and no frontal sinus tenderness.  Mouth/Throat: Uvula is midline, oropharynx is clear and moist and mucous membranes are normal.  Eyes: Conjunctivae, EOM and lids are normal. Pupils are equal, round, and reactive to light. No foreign bodies found.  Neck: Trachea normal and normal range of motion. Neck supple. Carotid bruit is not present. No mass and no thyromegaly present.  Cardiovascular: Regular rhythm, S1 normal, S2 normal, normal heart sounds, intact distal pulses and normal pulses.  Tachycardia present.  Exam reveals no gallop and no friction rub.   No murmur heard. Pulmonary/Chest: Effort normal and breath sounds normal. Not tachypneic. No respiratory distress. She has no decreased breath sounds. She has no wheezes. She has no rhonchi. She has no rales.  Neurological: She is alert.  Skin: Skin is warm, dry and intact. No rash noted.  Psychiatric: Her speech is normal and behavior is normal. Judgment normal. Her mood appears not anxious. Cognition and memory are normal. She does not exhibit a depressed mood.          Assessment & Plan:

## 2011-06-16 NOTE — Telephone Encounter (Signed)
patient calling asking to be seen because of a cough and having CHF, per pt? Pt is flying out in the morning and can't come tomorrow. Offered appt with Dr.Bedsole at 12:00pm and pt declined can only come after 2:30pm. Please advise

## 2011-06-16 NOTE — Telephone Encounter (Signed)
Being seen by Dr B at 3:15

## 2011-06-16 NOTE — Patient Instructions (Signed)
Drink fluids, moderate amounts. Rest. Mucinex DM for cough, tylenol for fever. We will call you with final CXR results. If negative will plan treatment for influenza with tamiflu x 5 days. Follow up with PCP in next week if not improving as expected.

## 2011-06-16 NOTE — Assessment & Plan Note (Signed)
Euvolemic. 

## 2011-06-30 ENCOUNTER — Ambulatory Visit (INDEPENDENT_AMBULATORY_CARE_PROVIDER_SITE_OTHER): Payer: 59 | Admitting: Internal Medicine

## 2011-06-30 ENCOUNTER — Encounter: Payer: Self-pay | Admitting: Internal Medicine

## 2011-06-30 VITALS — BP 130/80 | HR 80 | Temp 98.2°F | Ht 61.0 in | Wt 235.0 lb

## 2011-06-30 DIAGNOSIS — M25569 Pain in unspecified knee: Secondary | ICD-10-CM

## 2011-06-30 DIAGNOSIS — M25562 Pain in left knee: Secondary | ICD-10-CM

## 2011-06-30 DIAGNOSIS — M25561 Pain in right knee: Secondary | ICD-10-CM | POA: Insufficient documentation

## 2011-06-30 MED ORDER — HYDROCODONE-ACETAMINOPHEN 5-325 MG PO TABS: 1.0000 | ORAL_TABLET | Freq: Three times a day (TID) | ORAL | Status: DC | PRN

## 2011-06-30 NOTE — Progress Notes (Signed)
Subjective:    Patient ID: Katrina Andrews, female    DOB: 02/03/1946, 66 y.o.   MRN: [id number]  HPI Did have the flu Noted considerable improvement after 1.5 days on the tamiflu  Did go [place] west to see new grandchild ([place])  Has lost 10# with the diuretic  Now with worsened knee pain Really hard time walking (like during visit) Not taking NSAIDs due to potential side effects Going to [place] soon ---needs something to tide her through  Has appt coming up with Dr Jones Broom May consider surgery if heart okay---will pursue ortho  Current Outpatient Prescriptions on File Prior to Visit  Medication Sig Dispense Refill  . albuterol (VENTOLIN HFA) 108 (90 BASE) MCG/ACT inhaler Inhale 2 puffs into the lungs every 6 (six) hours as needed for wheezing.  1 Inhaler  1  . cetirizine (ZYRTEC) 10 MG tablet Take 10 mg by mouth at bedtime.        . furosemide (LASIX) 40 MG tablet Take 80 mg by mouth daily. Take second one if home weight above 238#       . ketoconazole (NIZORAL) 2 % cream Apply 1 application topically daily as needed. For dry skin       . Melatonin 5 MG CAPS Take 5 mg by mouth at bedtime as needed. For sleep      . montelukast (SINGULAIR) 10 MG tablet Take 1 tablet (10 mg total) by mouth at bedtime.  30 tablet  11  . PARoxetine (PAXIL) 20 MG tablet Take 20 mg by mouth every morning. Take one by mouth daily       . potassium chloride (K-DUR) 10 MEQ tablet Take 2 tablets (20 mEq total) by mouth daily.  60 tablet  3  . rOPINIRole (REQUIP) 1 MG tablet Take 1 mg by mouth at bedtime.          No Known Allergies  Past Medical History  Diagnosis Date  . Allergy   . Depression   . Diverticulitis     colon  . Hyperlipidemia   . Hypertension   . Ovarian cyst   . Urinary incontinence     Past Surgical History  Procedure Date  . Cholecystectomy   . Abdominal hysterectomy   . Incontinence surgery 03/2005  . Ankle fracture surgery 1983    left, screw  . Vaginal delivery       x3  . Knee cartilage surgery 2000    torn, left knee no surgery  . Dobutamine stress echo 09/1995  . Laparoscopic gastric banding 09/2007  . Carpal tunnel release 1/12    Dr Hyacinth Meeker    Family History  Problem Relation Age of Onset  . COPD Mother   . Bone cancer Father   . Testicular cancer Brother   . Parkinsonism Paternal Aunt   . Diabetes Neg Hx     History   Social History  . Marital Status: Legally Separated    Spouse Name: N/A    Number of Children: 3  . Years of Education: N/A   Occupational History  . retired Engineer, civil (consulting)   . 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 part-time at [employer]    Social History Main Topics  . Smoking status: Former Smoker    Types: Cigarettes    Quit date: 06/06/1984  . Smokeless tobacco: Never Used  . Alcohol Use: No  . Drug Use: No  . Sexually Active: Not on file   Other Topics Concern  . Not  on file   Social History Narrative  . No narrative on file   Review of Systems Sleep is fairly good Hasn't been using the melatonin    Objective:   Physical Exam  Constitutional: She appears well-developed and well-nourished.  Musculoskeletal:       Thickening in both knees without effusion No sig crepitus          Assessment & Plan:

## 2011-06-30 NOTE — Assessment & Plan Note (Addendum)
May be arthritic but not clear cut Will be pursuing orthopedic eval NSAID relatively contraindicated Will try hydrocodone-- she has tolerated this in the past

## 2011-06-30 NOTE — Patient Instructions (Signed)
Cancel appt for 2/12

## 2011-07-14 ENCOUNTER — Ambulatory Visit (HOSPITAL_COMMUNITY)
Admission: RE | Admit: 2011-07-14 | Discharge: 2011-07-14 | Disposition: A | Discharge status: Home / Self Care | Payer: 59 | Source: Ambulatory Visit | Attending: Internal Medicine | Admitting: Internal Medicine

## 2011-07-14 ENCOUNTER — Encounter (HOSPITAL_COMMUNITY): Payer: Self-pay

## 2011-07-14 VITALS — BP 144/80 | HR 72 | Wt 237.5 lb

## 2011-07-14 DIAGNOSIS — R0602 Shortness of breath: Secondary | ICD-10-CM

## 2011-07-14 DIAGNOSIS — I509 Heart failure, unspecified: Secondary | ICD-10-CM

## 2011-07-14 DIAGNOSIS — R0989 Other specified symptoms and signs involving the circulatory and respiratory systems: Secondary | ICD-10-CM | POA: Insufficient documentation

## 2011-07-14 DIAGNOSIS — R0609 Other forms of dyspnea: Secondary | ICD-10-CM | POA: Insufficient documentation

## 2011-07-14 DIAGNOSIS — I5032 Chronic diastolic (congestive) heart failure: Secondary | ICD-10-CM

## 2011-07-14 HISTORY — DX: Obesity, unspecified: E66.9

## 2011-07-14 HISTORY — DX: Chronic diastolic (congestive) heart failure: I50.32

## 2011-07-14 NOTE — Progress Notes (Signed)
HPI:  Katrina Andrews is a 66 y/o woman with a h/o obesity (prveiously on Phen-fen), HTN, rheumatoid arthritis, OSA (non-compliant with CPAP), diastolic HF and dyspnea. We saw her previously (last in 2009) and she had a Myoview in 2008 which is normal.  Returns for new patient visit to further evaluate nighttime cough and dyspnea. Cough started in June 2012. Got worse. Treated with abx without much improvement. Then treated for allergies also without much improvement. Admitted to [facility] in Nov 2012 with LLL PNA and diastolic HF. pBNP 1900. Was diuresed and treated with abx with about 90% recovery in her cough. After her hospitalization cough returned to a point. Weight up from 237 to 246 and diuretics increased. Echo in 11/12 EF 50-55% trivial TR. RV normal. No comment on diastolic parameters. Repeat BNP in November 2012 was normal.   Since that time weight back to baseline. Nighttime cough improved but not resolved. Just got back from [place] and was able to walk all around without problem. Sleeps with 1 pillow. No edema, orthopnea, PND. No CP. Not wearing CPAP. Struggling with severe knee pain. Weighs every day. Not using sliding scale diuretics.  Review of Systems:     Cardiac Review of Systems: {Y] = yes [ ]  = no  Chest Pain [    ]  Resting SOB [   ] Exertional SOB  [  ]  Orthopnea [  ]   Pedal Edema [   ]    Palpitations [  ] Syncope  [  ]   Presyncope [   ]  General Review of Systems: [Y] = yes [  ]=no Constitional: recent weight change [  ]; anorexia [  ]; fatigue [  ]; nausea [  ]; night sweats [  ]; fever [  ]; or chills [  ];                                                                                                                                          Eye : blurred vision [  ]; diplopia [   ]; vision changes [  ];  Amaurosis fugax[  ]; Resp: cough [Y  ];  wheezing[Y  ];  hemoptysis[  ]; shortness of breath[  ]; paroxysmal nocturnal dyspnea[  ]; dyspnea on exertion[  ]; or orthopnea[  ];    GI:  gallstones[  ], vomiting[  ];  dysphagia[  ]; melena[  ];  hematochezia [  ]; heartburn[  ];   Hx of  Colonoscopy[  ]; GU: kidney stones [  ]; hematuria[  ];   dysuria [  ];  nocturia[  ];  history of     obstruction [  ];                 Skin: rash, swelling[  ];, hair loss[  ];  peripheral edema[  ];  or  itching[  ]; Musculosketetal: myalgias[  ];  joint swelling[  ];  joint erythema[  ];  joint pain[ Y ];  back pain[  ];  Heme/Lymph: bruising[  ];  bleeding[  ];  anemia[  ];  Neuro: TIA[  ];  headaches[  ];  stroke[  ];  vertigo[  ];  seizures[  ];   paresthesias[  ];  difficulty walking[Y  ];  Psych:depression[  ]; anxiety[  ];  Endocrine: diabetes[  ];  thyroid dysfunction[  ];  Immunizations: Flu [  ]; Pneumococcal[  ];  Other:    Past Medical History  Diagnosis Date  . Allergy   . Depression   . Diverticulitis     colon  . Hyperlipidemia   . Hypertension   . Ovarian cyst   . Urinary incontinence     Current Outpatient Prescriptions  Medication Sig Dispense Refill  . cetirizine (ZYRTEC) 10 MG tablet Take 10 mg by mouth at bedtime.        . furosemide (LASIX) 40 MG tablet Take 80 mg by mouth daily. Take second one if home weight above 238#       . ketoconazole (NIZORAL) 2 % cream Apply 1 application topically daily as needed. For dry skin       . montelukast (SINGULAIR) 10 MG tablet Take 1 tablet (10 mg total) by mouth at bedtime.  30 tablet  11  . PARoxetine (PAXIL) 20 MG tablet Take 20 mg by mouth every morning. Take one by mouth daily       . potassium chloride (K-DUR) 10 MEQ tablet Take 2 tablets (20 mEq total) by mouth daily.  60 tablet  3  . rOPINIRole (REQUIP) 1 MG tablet Take 1 mg by mouth at bedtime.        Marland Kitchen albuterol (VENTOLIN HFA) 108 (90 BASE) MCG/ACT inhaler Inhale 2 puffs into the lungs every 6 (six) hours as needed for wheezing.  1 Inhaler  1  . Melatonin 5 MG CAPS Take 5 mg by mouth at bedtime as needed. For sleep         No Known  Allergies  History   Social History  . Marital Status: Legally Separated    Spouse Name: N/A    Number of Children: 3  . Years of Education: N/A   Occupational History  . retired Engineer, civil (consulting)   . 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 part-time at [employer]    Social History Main Topics  . Smoking status: Former Smoker    Types: Cigarettes    Quit date: 06/06/1984  . Smokeless tobacco: Never Used  . Alcohol Use: No  . Drug Use: No  . Sexually Active: Not on file   Other Topics Concern  . Not on file   Social History Narrative  . No narrative on file    Family History  Problem Relation Age of Onset  . COPD Mother   . Bone cancer Father   . Testicular cancer Brother   . Parkinsonism Paternal Aunt   . Diabetes Neg Hx     PHYSICAL EXAM: Filed Vitals:   07/14/11 0936  BP: 144/80  Pulse: 72   General:  Well appearing. No respiratory difficulty HEENT: normal Neck: supple. no JVD. Carotids 2+ bilat; no bruits. No lymphadenopathy or thryomegaly appreciated. Cor: PMI nondisplaced. Regular rate & rhythm. No rubs, gallops or murmurs. Lungs: clear Abdomen: obese soft, nontender, nondistended. No hepatosplenomegaly. No bruits or masses. Good bowel sounds.  Extremities: no cyanosis, clubbing, rash, edema Neuro: alert & oriented x 3, cranial nerves grossly intact. moves all 4 extremities w/o difficulty. Affect pleasant.   No results found for this or any previous visit (from the past 24 hour(s)). No results found.   ASSESSMENT & PLAN:

## 2011-07-14 NOTE — Patient Instructions (Signed)
We will contact you in 3 months to schedule your next appointment.  

## 2011-07-19 ENCOUNTER — Ambulatory Visit: Payer: 59 | Admitting: Internal Medicine

## 2011-07-24 NOTE — Assessment & Plan Note (Signed)
Suspect this is multifactorial. Volume status looks good. Encouraged continued attempts with weight loss and exercise.

## 2011-07-24 NOTE — Assessment & Plan Note (Signed)
Volume status looks quite good now.  No evidence volume overload. Reinforced need for daily weights and reviewed use of sliding scale diuretics.

## 2011-07-25 ENCOUNTER — Encounter: Payer: Self-pay | Admitting: Internal Medicine

## 2011-07-25 ENCOUNTER — Ambulatory Visit (INDEPENDENT_AMBULATORY_CARE_PROVIDER_SITE_OTHER): Payer: 59 | Admitting: Internal Medicine

## 2011-07-25 VITALS — BP 140/80 | HR 87 | Temp 98.2°F | Ht 61.0 in | Wt 232.0 lb

## 2011-07-25 DIAGNOSIS — F329 Major depressive disorder, single episode, unspecified: Secondary | ICD-10-CM

## 2011-07-25 DIAGNOSIS — F3289 Other specified depressive episodes: Secondary | ICD-10-CM

## 2011-07-25 NOTE — Progress Notes (Signed)
Subjective:    Patient ID: Katrina Andrews, female    DOB: [birth year], 66 y.o.   MRN: [id number]  HPI Wants to talk about the paxil Doesn't seem to be working well "I see a lot of things about me that I don't like seeing" Overwhelmed and doesn't want tot do anything Trouble getting to sleep and then often doesn't want to get up in the morning  Physically feels fine Knees are better and basically off pain meds Breathing is better  Spends time on computer Still enjoys work usually--- 3-5 hours several days per week Lonely Has had a bad time recently with hospital stay, etc  Appetite is poor--eats "because it is there" Feels helpless about her house---no incentive to clean or even empty dishwasher Not hopeless No thoughts of death or suicide  Current Outpatient Prescriptions on File Prior to Visit  Medication Sig Dispense Refill  . albuterol (VENTOLIN HFA) 108 (90 BASE) MCG/ACT inhaler Inhale 2 puffs into the lungs every 6 (six) hours as needed for wheezing.  1 Inhaler  1  . cetirizine (ZYRTEC) 10 MG tablet Take 10 mg by mouth at bedtime.        . furosemide (LASIX) 40 MG tablet Take 80 mg by mouth daily. Take second one if home weight above 238#       . ketoconazole (NIZORAL) 2 % cream Apply 1 application topically daily as needed. For dry skin       . Melatonin 5 MG CAPS Take 5 mg by mouth at bedtime as needed. For sleep      . montelukast (SINGULAIR) 10 MG tablet Take 1 tablet (10 mg total) by mouth at bedtime.  30 tablet  11  . PARoxetine (PAXIL) 20 MG tablet Take 20 mg by mouth every morning. Take one by mouth daily       . potassium chloride (K-DUR) 10 MEQ tablet Take 2 tablets (20 mEq total) by mouth daily.  60 tablet  3  . rOPINIRole (REQUIP) 1 MG tablet Take 1 mg by mouth at bedtime.          No Known Allergies  Past Medical History  Diagnosis Date  . Allergy   . Depression   . Diverticulitis     colon  . Hyperlipidemia   . Hypertension   . Ovarian cyst   .  Urinary incontinence   . Obesity   . Chronic diastolic heart failure     Past Surgical History  Procedure Date  . Cholecystectomy   . Abdominal hysterectomy   . Incontinence surgery 03/2005  . Ankle fracture surgery 1983    left, screw  . Vaginal delivery     x3  . Knee cartilage surgery 2000    torn, left knee no surgery  . Dobutamine stress echo 09/1995  . Laparoscopic gastric banding 09/2007  . Carpal tunnel release 1/12    Dr Hyacinth Meeker    Family History  Problem Relation Age of Onset  . COPD Mother   . Bone cancer Father   . Testicular cancer Brother   . Parkinsonism Paternal Aunt   . Diabetes Neg Hx     History   Social History  . Marital Status: Legally Separated    Spouse Name: N/A    Number of Children: 3  . Years of Education: N/A   Occupational History  . retired Engineer, civil (consulting)   . 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 part-time at [employer]    Social History Main  Topics  . Smoking status: Former Smoker    Types: Cigarettes    Quit date: 06/06/1984  . Smokeless tobacco: Never Used  . Alcohol Use: No  . Drug Use: No  . Sexually Active: Not on file   Other Topics Concern  . Not on file   Social History Narrative  . No narrative on file   Review of Systems     Objective:   Physical Exam  Psychiatric:       Slightly tearful but not persistently depressed Affect appropriate          Assessment & Plan:

## 2011-07-25 NOTE — Assessment & Plan Note (Signed)
Mood is worse Seems mostly secondary to medical problems Discussed counseling---she is willing to try this Consider increasing paroxetine if not better at follow up

## 2011-08-04 ENCOUNTER — Other Ambulatory Visit: Payer: Self-pay | Admitting: *Deleted

## 2011-08-04 MED ORDER — FUROSEMIDE 40 MG PO TABS: 80.0000 mg | ORAL_TABLET | Freq: Every day | ORAL | Status: DC

## 2011-08-09 ENCOUNTER — Ambulatory Visit (INDEPENDENT_AMBULATORY_CARE_PROVIDER_SITE_OTHER): Payer: 59 | Admitting: Psychology

## 2011-08-09 DIAGNOSIS — F4323 Adjustment disorder with mixed anxiety and depressed mood: Secondary | ICD-10-CM

## 2011-08-22 ENCOUNTER — Other Ambulatory Visit: Payer: Self-pay | Admitting: *Deleted

## 2011-08-22 NOTE — Telephone Encounter (Signed)
Taken off her list If she still needs it prn, can refill #60 x 0

## 2011-08-23 MED ORDER — HYDROCODONE-ACETAMINOPHEN 5-325 MG PO TABS: 1.0000 | ORAL_TABLET | Freq: Three times a day (TID) | ORAL | Status: DC | PRN

## 2011-08-23 NOTE — Telephone Encounter (Signed)
rx called into pharmacy rx had a END date, pt still takes.

## 2011-08-24 ENCOUNTER — Ambulatory Visit: Payer: 59 | Admitting: Psychology

## 2011-09-01 ENCOUNTER — Other Ambulatory Visit: Payer: Self-pay | Admitting: *Deleted

## 2011-09-01 MED ORDER — POTASSIUM CHLORIDE ER 10 MEQ PO TBCR: 20.0000 meq | EXTENDED_RELEASE_TABLET | Freq: Every day | ORAL | Status: DC

## 2011-09-14 ENCOUNTER — Ambulatory Visit (INDEPENDENT_AMBULATORY_CARE_PROVIDER_SITE_OTHER): Payer: 59 | Admitting: Internal Medicine

## 2011-09-14 ENCOUNTER — Ambulatory Visit (INDEPENDENT_AMBULATORY_CARE_PROVIDER_SITE_OTHER): Payer: 59 | Admitting: Psychology

## 2011-09-14 ENCOUNTER — Encounter: Payer: Self-pay | Admitting: Internal Medicine

## 2011-09-14 VITALS — BP 132/82 | HR 63 | Temp 97.7°F | Wt 232.0 lb

## 2011-09-14 DIAGNOSIS — F4323 Adjustment disorder with mixed anxiety and depressed mood: Secondary | ICD-10-CM

## 2011-09-14 DIAGNOSIS — J019 Acute sinusitis, unspecified: Secondary | ICD-10-CM

## 2011-09-14 MED ORDER — AMOXICILLIN 500 MG PO TABS: 1000.0000 mg | ORAL_TABLET | Freq: Two times a day (BID) | ORAL | Status: AC

## 2011-09-14 MED ORDER — PREDNISONE 20 MG PO TABS: 40.0000 mg | ORAL_TABLET | Freq: Every day | ORAL | Status: AC

## 2011-09-14 NOTE — Progress Notes (Signed)
Subjective:    Patient ID: Katrina Andrews, female    DOB: 02-Jun-1946, 66 y.o.   MRN: [id number]  HPI Has had congestion in head and coarse cough Started 5-6 days ago after plane flight  Ears never cleared Lots of drainage Cough esp bad at night-- occ mucus (yellow) Left side sore throat--also bad with swallowing  Has been on the zyrtec and singulair for allergies No other meds  May have had low grade fever with chills in afternoon No SOB Did try albuterol yesterday but not really wheezing  Current Outpatient Prescriptions on File Prior to Visit  Medication Sig Dispense Refill  . albuterol (VENTOLIN HFA) 108 (90 BASE) MCG/ACT inhaler Inhale 2 puffs into the lungs every 6 (six) hours as needed for wheezing.  1 Inhaler  1  . cetirizine (ZYRTEC) 10 MG tablet Take 10 mg by mouth at bedtime.        . furosemide (LASIX) 40 MG tablet Take 2 tablets (80 mg total) by mouth daily. Take second one if home weight above 238#  60 tablet  11  . ketoconazole (NIZORAL) 2 % cream Apply 1 application topically daily as needed. For dry skin       . Melatonin 5 MG CAPS Take 5 mg by mouth at bedtime as needed. For sleep      . montelukast (SINGULAIR) 10 MG tablet Take 1 tablet (10 mg total) by mouth at bedtime.  30 tablet  11  . PARoxetine (PAXIL) 20 MG tablet Take 20 mg by mouth every morning. Take one by mouth daily       . potassium chloride (K-DUR) 10 MEQ tablet Take 2 tablets (20 mEq total) by mouth daily.  60 tablet  3  . rOPINIRole (REQUIP) 1 MG tablet Take 1 mg by mouth at bedtime.        Marland Kitchen DISCONTD: furosemide (LASIX) 40 MG tablet Take 1 tablet (40 mg total) by mouth daily.  30 tablet  0  . DISCONTD: furosemide (LASIX) 40 MG tablet Take 1 tablet (40 mg total) by mouth 2 (two) times daily. Take second one if home weight above 238#  60 tablet  3    No Known Allergies  Past Medical History  Diagnosis Date  . Allergy   . Depression   . Diverticulitis     colon  . Hyperlipidemia   .  Hypertension   . Ovarian cyst   . Urinary incontinence   . Obesity   . Chronic diastolic heart failure     Past Surgical History  Procedure Date  . Cholecystectomy   . Abdominal hysterectomy   . Incontinence surgery 03/2005  . Ankle fracture surgery 1983    left, screw  . Vaginal delivery     x3  . Knee cartilage surgery 2000    torn, left knee no surgery  . Dobutamine stress echo 09/1995  . Laparoscopic gastric banding 09/2007  . Carpal tunnel release 1/12    Dr Hyacinth Meeker    Family History  Problem Relation Age of Onset  . COPD Mother   . Bone cancer Father   . Testicular cancer Brother   . Parkinsonism Paternal Aunt   . Diabetes Neg Hx     History   Social History  . Marital Status: Legally Separated    Spouse Name: N/A    Number of Children: 3  . Years of Education: N/A   Occupational History  . retired Engineer, civil (consulting)   . 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  part-time at [employer]    Social History Main Topics  . Smoking status: Former Smoker    Types: Cigarettes    Quit date: 06/06/1984  . Smokeless tobacco: Never Used  . Alcohol Use: No  . Drug Use: No  . Sexually Active: Not on file   Other Topics Concern  . Not on file   Social History Narrative  . No narrative on file   Review of Systems No rash No vomiting or diarrhea Feels tired     Objective:   Physical Exam  Constitutional: She appears well-developed and well-nourished. No distress.  HENT:  Head: Normocephalic and atraumatic.  Mouth/Throat: Oropharynx is clear and moist. No oropharyngeal exudate.       TMs normal Moderate nasal inflammation  Neck: Normal range of motion. Neck supple.  Pulmonary/Chest: Effort normal. No respiratory distress. She has wheezes. She has no rales.       Slight exp wheezes at end but not tight Normal exp phase  Lymphadenopathy:    She has no cervical adenopathy.          Assessment & Plan:

## 2011-09-14 NOTE — Assessment & Plan Note (Signed)
Ear symptoms and sinus pressure Will be flying again soon Fair chance of bacterial infection---will treat with amoxil Prednisone for eustachian tubes

## 2011-09-28 ENCOUNTER — Ambulatory Visit: Payer: 59 | Admitting: Internal Medicine

## 2011-10-04 ENCOUNTER — Other Ambulatory Visit: Payer: Self-pay | Admitting: *Deleted

## 2011-10-04 MED ORDER — PAROXETINE HCL 20 MG PO TABS: 20.0000 mg | ORAL_TABLET | Freq: Every day | ORAL | Status: DC

## 2011-10-04 NOTE — Telephone Encounter (Signed)
rx sent to pharmacy by e-script  

## 2011-10-05 ENCOUNTER — Ambulatory Visit: Payer: 59 | Admitting: Internal Medicine

## 2011-10-11 ENCOUNTER — Ambulatory Visit: Payer: 59 | Admitting: Psychology

## 2011-10-12 ENCOUNTER — Ambulatory Visit (INDEPENDENT_AMBULATORY_CARE_PROVIDER_SITE_OTHER): Payer: 59 | Admitting: Internal Medicine

## 2011-10-12 ENCOUNTER — Encounter: Payer: Self-pay | Admitting: Internal Medicine

## 2011-10-12 VITALS — BP 140/90 | HR 91 | Temp 97.8°F | Wt 233.0 lb

## 2011-10-12 DIAGNOSIS — M719 Bursopathy, unspecified: Secondary | ICD-10-CM

## 2011-10-12 DIAGNOSIS — M75102 Unspecified rotator cuff tear or rupture of left shoulder, not specified as traumatic: Secondary | ICD-10-CM

## 2011-10-12 DIAGNOSIS — F329 Major depressive disorder, single episode, unspecified: Secondary | ICD-10-CM

## 2011-10-12 DIAGNOSIS — F3289 Other specified depressive episodes: Secondary | ICD-10-CM

## 2011-10-12 DIAGNOSIS — M67919 Unspecified disorder of synovium and tendon, unspecified shoulder: Secondary | ICD-10-CM

## 2011-10-12 NOTE — Patient Instructions (Signed)
Please call if you would like to try cortisone shot in shoulder

## 2011-10-12 NOTE — Assessment & Plan Note (Signed)
Mild May have touch of bursitis Exam doesn't suggest sig arthritis Offered cortisone shot but she wants to defer Will continue massage, chiropractic and ice Will try 3 day course of ibuprofen

## 2011-10-12 NOTE — Progress Notes (Signed)
Subjective:    Patient ID: Katrina Andrews, female    DOB: 09/17/1945, 66 y.o.   MRN: [id number]  HPI Having a problem with her left shoulder Tried massage and chiropractor Concern for A-C joint---limited abduction Has burning in hand at night Has tried ice  Wonders about anti-inflammatory to reduce pain Hasn't been able to take advil due to fluid retention Uses tylenol and occ the hydrocodone  Mood is better Saw Dr Laymond Purser one and a half times---some time mix-ups Has been frequent enough for her This is better though  Current Outpatient Prescriptions on File Prior to Visit  Medication Sig Dispense Refill  . albuterol (VENTOLIN HFA) 108 (90 BASE) MCG/ACT inhaler Inhale 2 puffs into the lungs every 6 (six) hours as needed for wheezing.  1 Inhaler  1  . cetirizine (ZYRTEC) 10 MG tablet Take 10 mg by mouth at bedtime.        . furosemide (LASIX) 40 MG tablet Take 2 tablets (80 mg total) by mouth daily. Take second one if home weight above 238#  60 tablet  11  . ketoconazole (NIZORAL) 2 % cream Apply 1 application topically daily as needed. For dry skin       . Melatonin 5 MG CAPS Take 5 mg by mouth at bedtime as needed. For sleep      . montelukast (SINGULAIR) 10 MG tablet Take 1 tablet (10 mg total) by mouth at bedtime.  30 tablet  11  . PARoxetine (PAXIL) 20 MG tablet Take 1 tablet (20 mg total) by mouth daily.  30 tablet  11  . potassium chloride (K-DUR) 10 MEQ tablet Take 2 tablets (20 mEq total) by mouth daily.  60 tablet  3  . rOPINIRole (REQUIP) 1 MG tablet Take 1 mg by mouth at bedtime.        Marland Kitchen DISCONTD: furosemide (LASIX) 40 MG tablet Take 1 tablet (40 mg total) by mouth daily.  30 tablet  0  . DISCONTD: furosemide (LASIX) 40 MG tablet Take 1 tablet (40 mg total) by mouth 2 (two) times daily. Take second one if home weight above 238#  60 tablet  3    No Known Allergies  Past Medical History  Diagnosis Date  . Allergy   . Depression   . Diverticulitis     colon  .  Hyperlipidemia   . Hypertension   . Ovarian cyst   . Urinary incontinence   . Obesity   . Chronic diastolic heart failure     Past Surgical History  Procedure Date  . Cholecystectomy   . Abdominal hysterectomy   . Incontinence surgery 03/2005  . Ankle fracture surgery 1983    left, screw  . Vaginal delivery     x3  . Knee cartilage surgery 2000    torn, left knee no surgery  . Dobutamine stress echo 09/1995  . Laparoscopic gastric banding 09/2007  . Carpal tunnel release 1/12    Dr Hyacinth Meeker    Family History  Problem Relation Age of Onset  . COPD Mother   . Bone cancer Father   . Testicular cancer Brother   . Parkinsonism Paternal Aunt   . Diabetes Neg Hx     History   Social History  . Marital Status: Legally Separated    Spouse Name: N/A    Number of Children: 3  . Years of Education: N/A   Occupational History  . retired Engineer, civil (consulting)   . 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  part-time at [employer]    Social History Main Topics  . Smoking status: Former Smoker    Types: Cigarettes    Quit date: 06/06/1984  . Smokeless tobacco: Never Used  . Alcohol Use: No  . Drug Use: No  . Sexually Active: Not on file   Other Topics Concern  . Not on file   Social History Narrative  . No narrative on file   Review of Systems Recent trip to [place] to see sister Sinus issues are better but left ear is still not completely clear (thinks the prednisone might have helped)    Objective:   Physical Exam  Constitutional: She appears well-developed and well-nourished. No distress.  Musculoskeletal:       Mild anterior tenderness in left shoulder Passive abduction to ~90 degrees without sig pain Pain with external rotation (and limited) Internal rotation fairly normal  Psychiatric: She has a normal mood and affect. Her behavior is normal.          Assessment & Plan:

## 2011-10-12 NOTE — Assessment & Plan Note (Signed)
Better now Bad time seems to have passed No clear benefit from limited counseling

## 2011-10-17 ENCOUNTER — Other Ambulatory Visit: Payer: Self-pay | Admitting: *Deleted

## 2011-10-17 MED ORDER — PREDNISONE 20 MG PO TABS: ORAL_TABLET | ORAL | Status: DC

## 2011-10-17 NOTE — Telephone Encounter (Signed)
Faxed refill request   

## 2011-10-17 NOTE — Telephone Encounter (Signed)
Okay #24 x 0 Let her know she can use this 4 different times if she is travelling

## 2011-10-17 NOTE — Telephone Encounter (Signed)
Patient advised.

## 2011-11-02 ENCOUNTER — Ambulatory Visit: Payer: 59 | Admitting: Psychology

## 2011-11-04 ENCOUNTER — Encounter (HOSPITAL_COMMUNITY): Payer: 59

## 2011-11-21 ENCOUNTER — Ambulatory Visit (HOSPITAL_COMMUNITY): Payer: 59

## 2011-12-06 ENCOUNTER — Other Ambulatory Visit: Payer: Self-pay | Admitting: *Deleted

## 2011-12-06 NOTE — Telephone Encounter (Signed)
Find out what is going on---she had shoulder problems in the past If she has ongoing pain, okay to refill #30 x 0

## 2011-12-06 NOTE — Telephone Encounter (Signed)
Received request from pharmacy for a refill on Hydrocodone/APAP 5-325 mg ,take one tablet by mouth 3 times a day as needed for pain, LR 08/23/11 #60. Medication is no longer on med sheet. Is it okay to refill medication?

## 2011-12-07 ENCOUNTER — Telehealth: Payer: Self-pay

## 2011-12-07 MED ORDER — HYDROCODONE-ACETAMINOPHEN 5-325 MG PO TABS: 1.0000 | ORAL_TABLET | Freq: Three times a day (TID) | ORAL | Status: AC | PRN

## 2011-12-07 NOTE — Telephone Encounter (Signed)
Pt request referral to Dr Guy Franco [facility] for lt side neck pain,both shoulders are frozen and numbness in lt ring and middle finger. Pt will wait to hear from pt care coordinator or CMA.Please advise.

## 2011-12-07 NOTE — Telephone Encounter (Signed)
Spoke with patient and she need something to take the edge off, she has knee, back and shoulder pain. She's doing physical therapy and did see a rheumatologist and orthopedist, pt states she's going on vacation and would like to "participate" in activities. Please advise

## 2011-12-07 NOTE — Telephone Encounter (Signed)
rx called into pharmacy Left message for patient that rx has been called in.

## 2011-12-07 NOTE — Telephone Encounter (Signed)
Okay to refill the #30 x 0

## 2011-12-10 NOTE — Telephone Encounter (Signed)
Please call her I am not convinced that a neurology consultation makes sense with her complaints. It is still probably related to her shoulder and even if it had to do with her neck, it would be a neurosurgeon to see I think she probably should come back for evaluation here by me or Dr Patsy Lager before deciding on a consultation

## 2011-12-12 ENCOUNTER — Ambulatory Visit (INDEPENDENT_AMBULATORY_CARE_PROVIDER_SITE_OTHER): Payer: 59 | Admitting: Internal Medicine

## 2011-12-12 ENCOUNTER — Ambulatory Visit: Payer: 59 | Admitting: Internal Medicine

## 2011-12-12 ENCOUNTER — Encounter: Payer: Self-pay | Admitting: Internal Medicine

## 2011-12-12 VITALS — BP 140/80 | HR 150 | Temp 98.1°F | Resp 14 | Wt 237.0 lb

## 2011-12-12 DIAGNOSIS — R05 Cough: Secondary | ICD-10-CM

## 2011-12-12 DIAGNOSIS — R059 Cough, unspecified: Secondary | ICD-10-CM

## 2011-12-12 DIAGNOSIS — M069 Rheumatoid arthritis, unspecified: Secondary | ICD-10-CM

## 2011-12-12 DIAGNOSIS — I5032 Chronic diastolic (congestive) heart failure: Secondary | ICD-10-CM

## 2011-12-12 NOTE — Assessment & Plan Note (Addendum)
May be related to apparent weight gain and fluid retention on diclofenac She will stop this Use aleve just at bedtime (less cardiovascular effect) No evidence of infection

## 2011-12-12 NOTE — Telephone Encounter (Signed)
Patient in today for OV, Dr.Letvak will discuss then.

## 2011-12-12 NOTE — Progress Notes (Signed)
Subjective:    Patient ID: Katrina Andrews, female    DOB: December 20, 1945, 66 y.o.   MRN: [id number]  HPI "I just want to feel better" Having cough at night again---goes back 3 nights Concerned that diclofenac was causing water retention---thought the diuretic wasn't working  Weight is up a couple of pounds--took the 3rd fluid pill today also Some liquidy secretions at first---now more dry No significant SOB but did get winded quicker carrying something Low grade fever intermittently  Neck pain and into shoulders Diclofenac did seem to help some Has used aleve without similar problems  Flew recently Took the prednisone and it really helped her joints Saw Dr Charisse March cortisone shot in both shoulders and diagnosed with tendonitis Then saw Dr Gavin Potters  Ongoing fatigue Blood work just done---results pending  Worries about myeloma (has family history)  Current Outpatient Prescriptions on File Prior to Visit  Medication Sig Dispense Refill  . albuterol (VENTOLIN HFA) 108 (90 BASE) MCG/ACT inhaler Inhale 2 puffs into the lungs every 6 (six) hours as needed for wheezing.  1 Inhaler  1  . cetirizine (ZYRTEC) 10 MG tablet Take 10 mg by mouth at bedtime.        . furosemide (LASIX) 40 MG tablet Take 2 tablets (80 mg total) by mouth daily. Take second one if home weight above 238#  60 tablet  11  . HYDROcodone-acetaminophen (NORCO) 5-325 MG per tablet Take 1 tablet by mouth 3 (three) times daily as needed for pain.  30 tablet  0  . ketoconazole (NIZORAL) 2 % cream Apply 1 application topically daily as needed. For dry skin       . Melatonin 5 MG CAPS Take 5 mg by mouth at bedtime as needed. For sleep      . montelukast (SINGULAIR) 10 MG tablet Take 1 tablet (10 mg total) by mouth at bedtime.  30 tablet  11  . PARoxetine (PAXIL) 20 MG tablet Take 1 tablet (20 mg total) by mouth daily.  30 tablet  11  . potassium chloride (K-DUR) 10 MEQ tablet Take 2 tablets (20 mEq total) by mouth daily.  60  tablet  3  . rOPINIRole (REQUIP) 1 MG tablet Take 1 mg by mouth at bedtime.        Marland Kitchen DISCONTD: furosemide (LASIX) 40 MG tablet Take 1 tablet (40 mg total) by mouth daily.  30 tablet  0  . DISCONTD: furosemide (LASIX) 40 MG tablet Take 1 tablet (40 mg total) by mouth 2 (two) times daily. Take second one if home weight above 238#  60 tablet  3    No Known Allergies  Past Medical History  Diagnosis Date  . Allergy   . Depression   . Diverticulitis     colon  . Hyperlipidemia   . Hypertension   . Ovarian cyst   . Urinary incontinence   . Obesity   . Chronic diastolic heart failure     Past Surgical History  Procedure Date  . Cholecystectomy   . Abdominal hysterectomy   . Incontinence surgery 03/2005  . Ankle fracture surgery 1983    left, screw  . Vaginal delivery     x3  . Knee cartilage surgery 2000    torn, left knee no surgery  . Dobutamine stress echo 09/1995  . Laparoscopic gastric banding 09/2007  . Carpal tunnel release 1/12    Dr Hyacinth Meeker    Family History  Problem Relation Age of Onset  . COPD Mother   .  Bone cancer Father   . Testicular cancer Brother   . Parkinsonism Paternal Aunt   . Diabetes Neg Hx     History   Social History  . Marital Status: Legally Separated    Spouse Name: N/A    Number of Children: 3  . Years of Education: N/A   Occupational History  . retired Engineer, civil (consulting)   . 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 part-time at [employer]    Social History Main Topics  . Smoking status: Former Smoker    Types: Cigarettes    Quit date: 06/06/1984  . Smokeless tobacco: Never Used  . Alcohol Use: No  . Drug Use: No  . Sexually Active: Not on file   Other Topics Concern  . Not on file   Social History Narrative  . No narrative on file   Review of Systems Ongoing neck pain Shoulder issues persist Very frustrated    Objective:   Physical Exam  Constitutional: She appears well-developed and well-nourished. No distress.    Neck:       Stiff and spasm in neck muscles and traps  Cardiovascular: Normal rate, regular rhythm and normal heart sounds.  Exam reveals no gallop.   No murmur heard. Pulmonary/Chest: Effort normal and breath sounds normal. No respiratory distress. She has no wheezes. She has no rales.       No dullness  Musculoskeletal: She exhibits no edema.       Wrists with synovitis and some tenderness  Lymphadenopathy:    She has no cervical adenopathy.  Psychiatric:       Very frustrated          Assessment & Plan:

## 2011-12-12 NOTE — Assessment & Plan Note (Signed)
Very mild breathing changes with weight gain Will stop the diclofenac and continue furosemide

## 2011-12-12 NOTE — Assessment & Plan Note (Signed)
Diagnosis in question Has bilateral symmetric synovitis Had striking response to brief prednisone (for Eustachian tube dysfunction) Dr Gavin Potters just saw and did more testing

## 2012-01-04 ENCOUNTER — Other Ambulatory Visit: Payer: Self-pay | Admitting: *Deleted

## 2012-01-04 MED ORDER — POTASSIUM CHLORIDE ER 10 MEQ PO TBCR: 20.0000 meq | EXTENDED_RELEASE_TABLET | Freq: Every day | ORAL | Status: DC

## 2012-01-05 ENCOUNTER — Ambulatory Visit (HOSPITAL_COMMUNITY)
Admission: RE | Admit: 2012-01-05 | Discharge: 2012-01-05 | Disposition: A | Discharge status: Home / Self Care | Payer: BC Managed Care – PPO | Source: Ambulatory Visit | Attending: Internal Medicine | Admitting: Internal Medicine

## 2012-01-05 ENCOUNTER — Encounter (HOSPITAL_COMMUNITY): Payer: Self-pay

## 2012-01-05 VITALS — BP 128/72 | HR 82 | Resp 18 | Ht 62.0 in | Wt 232.4 lb

## 2012-01-05 DIAGNOSIS — I5032 Chronic diastolic (congestive) heart failure: Secondary | ICD-10-CM

## 2012-01-05 NOTE — Patient Instructions (Addendum)
Follow up in 12  months  Do the following things EVERYDAY: 1) Weigh yourself in the morning before breakfast. Write it down and keep it in a log. 2) Take your medicines as prescribed 3) Eat low salt foods-Limit salt (sodium) to 2000 mg per day.  4) Stay as active as you can everyday 5) Limit all fluids for the day to less than 2 liters 

## 2012-01-05 NOTE — Progress Notes (Signed)
Patient ID: Katrina Andrews, female   DOB: 06/17/1945, 66 y.o.   MRN: [id number] HPI:  Katrina Andrews is a 66 y/o woman with a h/o obesity (prveiously on Phen-fen), HTN, rheumatoid arthritis, OSA (non-compliant with CPAP), diastolic HF and dyspnea. We saw her previously (last in 2009) and she had a Myoview in 2008 which is normal.  Returns for new patient visit to further evaluate nighttime cough and dyspnea. Cough started in June 2012. Got worse. Treated with abx without much improvement. Then treated for allergies also without much improvement. Admitted to [facility] in Nov 2012 with LLL PNA and diastolic HF. pBNP 1900. Was diuresed and treated with abx with about 90% recovery in her cough. After her hospitalization cough returned to a point. Weight up from 237 to 246 and diuretics increased. Echo in 11/12 EF 50-55% trivial TR. RV normal. No comment on diastolic parameters. Repeat BNP in November 2012 was normal.   She returns for follow up. Just diagnosed with rheumatoid arthritis and bilateral frozen shoulder. Just started on Methotrexate 3 weeks ago.Complains of joint pain.  Cough resolved. Denies SOB/PND/Orthopnea. Denies lower extremity edema. Weight at home 232. She has one extra dose of lasix. Unable to exercise due to joint pain.  She does attend PT twice a week. Does not use CPAP.    Review of Systems:     Cardiac Review of Systems: {Y] = yes [ ]  = no  Chest Pain [    ]  Resting SOB [   ] Exertional SOB  [  ]  Orthopnea [  ]   Pedal Edema [   ]    Palpitations [  ] Syncope  [  ]   Presyncope [   ]  General Review of Systems: [Y] = yes [  ]=no Constitional: recent weight change [  ]; anorexia [  ]; fatigue [ Y ]; nausea [  ]; night sweats [  ]; fever [  ]; or chills [  ];                                                                                                                                          Eye : blurred vision [  ]; diplopia [   ]; vision changes [  ];  Amaurosis fugax[  ]; Resp: cough  [ ] ;  wheezing[  ];  hemoptysis[  ]; shortness of breath[  ]; paroxysmal nocturnal dyspnea[  ]; dyspnea on exertion[  ]; or orthopnea[  ];  GI:  gallstones[  ], vomiting[  ];  dysphagia[  ]; melena[  ];  hematochezia [  ]; heartburn[  ];   Hx of  Colonoscopy[  ]; GU: kidney stones [  ]; hematuria[  ];   dysuria [  ];  nocturia[  ];  history of     obstruction [  ];  Skin: rash, swelling[  ];, hair loss[  ];  peripheral edema[  ];  or itching[  ]; Musculosketetal: myalgias[  Y];  joint swelling[Y  ];  joint erythema[  ];  joint pain[ Y ];  back pain[ ] ;  Heme/Lymph: bruising[  ];  bleeding[  ];  anemia[  ];  Neuro: TIA[  ];  headaches[  ];  stroke[  ];  vertigo[  ];  seizures[  ];   paresthesias[  ];  difficulty walking[  ];  Psych:depression[Y  ]; anxiety[  ];  Endocrine: diabetes[  ];  thyroid dysfunction[  ];  Immunizations: Flu [  ]; Pneumococcal[  ];  Other:    Past Medical History  Diagnosis Date  . Allergy   . Depression   . Diverticulitis     colon  . Hyperlipidemia   . Hypertension   . Ovarian cyst   . Urinary incontinence   . Obesity   . Chronic diastolic heart failure     Current Outpatient Prescriptions  Medication Sig Dispense Refill  . albuterol (VENTOLIN HFA) 108 (90 BASE) MCG/ACT inhaler Inhale 2 puffs into the lungs every 6 (six) hours as needed for wheezing.  1 Inhaler  1  . cetirizine (ZYRTEC) 10 MG tablet Take 10 mg by mouth at bedtime.        . folic acid (FOLVITE) 1 MG tablet       . furosemide (LASIX) 40 MG tablet Take 2 tablets (80 mg total) by mouth daily. Take second one if home weight above 238#  60 tablet  11  . ketoconazole (NIZORAL) 2 % cream Apply 1 application topically daily as needed. For dry skin       . Melatonin 5 MG CAPS Take 5 mg by mouth at bedtime as needed. For sleep      . methotrexate (RHEUMATREX) 2.5 MG tablet Take 2.5 mg by mouth once a week.       . montelukast (SINGULAIR) 10 MG tablet Take 1 tablet (10 mg total) by  mouth at bedtime.  30 tablet  11  . PARoxetine (PAXIL) 20 MG tablet Take 1 tablet (20 mg total) by mouth daily.  30 tablet  11  . potassium chloride (K-DUR) 10 MEQ tablet Take 2 tablets (20 mEq total) by mouth daily.  60 tablet  3  . potassium chloride (K-DUR,KLOR-CON) 10 MEQ tablet       . predniSONE (STERAPRED UNI-PAK) 5 MG TABS Take 5 mg by mouth daily.       Marland Kitchen rOPINIRole (REQUIP) 1 MG tablet Take 1 mg by mouth at bedtime.       . traMADol (ULTRAM) 50 MG tablet       . DISCONTD: furosemide (LASIX) 40 MG tablet Take 1 tablet (40 mg total) by mouth daily.  30 tablet  0  . DISCONTD: furosemide (LASIX) 40 MG tablet Take 1 tablet (40 mg total) by mouth 2 (two) times daily. Take second one if home weight above 238#  60 tablet  3     No Known Allergies  History   Social History  . Marital Status: Legally Separated    Spouse Name: N/A    Number of Children: 3  . Years of Education: N/A   Occupational History  . retired Engineer, civil (consulting)   . 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 part-time at [employer]    Social History Main Topics  . Smoking status: Former Smoker    Types: Cigarettes  Quit date: 06/06/1984  . Smokeless tobacco: Never Used  . Alcohol Use: No  . Drug Use: No  . Sexually Active: Not on file   Other Topics Concern  . Not on file   Social History Narrative  . No narrative on file    Family History  Problem Relation Age of Onset  . COPD Mother   . Bone cancer Father   . Testicular cancer Brother   . Parkinsonism Paternal Aunt   . Diabetes Neg Hx     PHYSICAL EXAM: Filed Vitals:   01/05/12 1508  BP: 128/72  Pulse: 82  Resp: 18   General:  Well appearing. No respiratory difficulty HEENT: normal Neck: supple. no JVD. Carotids 2+ bilat; no bruits. No lymphadenopathy or thryomegaly appreciated. Cor: PMI nondisplaced. Regular rate & rhythm. No rubs, gallops or murmurs. Lungs: clear Abdomen: obese soft, nontender, nondistended. No hepatosplenomegaly. No  bruits or masses. Good bowel sounds. Extremities: no cyanosis, clubbing, rash, edema Neuro: alert & oriented x 3, cranial nerves grossly intact. moves all 4 extremities w/o difficulty. Affect pleasant.   No results found for this or any previous visit (from the past 24 hour(s)). No results found.   ASSESSMENT & PLAN:

## 2012-01-20 ENCOUNTER — Encounter: Payer: Self-pay | Admitting: Rheumatology

## 2012-02-04 NOTE — Assessment & Plan Note (Signed)
Patient seen and examined with Tonye Becket, NP. We discussed all aspects of the encounter. I agree with the assessment and plan as stated above.  She is doing well from a HF perspective. Volume status well controlled. Reinforced need for daily weights and reviewed use of sliding scale diuretics. Also encouraged increased exercise as she is able.

## 2012-02-05 ENCOUNTER — Encounter: Payer: Self-pay | Admitting: Rheumatology

## 2012-02-15 ENCOUNTER — Encounter: Payer: 59 | Admitting: Internal Medicine

## 2012-02-15 DIAGNOSIS — Z0289 Encounter for other administrative examinations: Secondary | ICD-10-CM

## 2012-03-06 ENCOUNTER — Encounter: Payer: Self-pay | Admitting: Rheumatology

## 2012-04-10 ENCOUNTER — Ambulatory Visit (INDEPENDENT_AMBULATORY_CARE_PROVIDER_SITE_OTHER): Payer: 59 | Admitting: Internal Medicine

## 2012-04-10 ENCOUNTER — Encounter: Payer: Self-pay | Admitting: Internal Medicine

## 2012-04-10 VITALS — BP 130/80 | HR 78 | Temp 98.2°F | Ht 61.5 in | Wt 231.0 lb

## 2012-04-10 DIAGNOSIS — I5032 Chronic diastolic (congestive) heart failure: Secondary | ICD-10-CM

## 2012-04-10 DIAGNOSIS — M064 Inflammatory polyarthropathy: Secondary | ICD-10-CM

## 2012-04-10 DIAGNOSIS — Z0000 Encounter for general adult medical examination without abnormal findings: Secondary | ICD-10-CM

## 2012-04-10 DIAGNOSIS — Z23 Encounter for immunization: Secondary | ICD-10-CM

## 2012-04-10 DIAGNOSIS — F329 Major depressive disorder, single episode, unspecified: Secondary | ICD-10-CM

## 2012-04-10 DIAGNOSIS — Z1231 Encounter for screening mammogram for malignant neoplasm of breast: Secondary | ICD-10-CM

## 2012-04-10 DIAGNOSIS — M1990 Unspecified osteoarthritis, unspecified site: Secondary | ICD-10-CM

## 2012-04-10 DIAGNOSIS — F3289 Other specified depressive episodes: Secondary | ICD-10-CM

## 2012-04-10 DIAGNOSIS — Z1211 Encounter for screening for malignant neoplasm of colon: Secondary | ICD-10-CM

## 2012-04-10 DIAGNOSIS — I10 Essential (primary) hypertension: Secondary | ICD-10-CM

## 2012-04-10 DIAGNOSIS — M1380 Other specified arthritis, unspecified site: Secondary | ICD-10-CM | POA: Insufficient documentation

## 2012-04-10 LAB — BASIC METABOLIC PANEL
CO2: 32 mEq/L (ref 19–32)
Calcium: 9.4 mg/dL (ref 8.4–10.5)
Creatinine, Ser: 0.9 mg/dL (ref 0.4–1.2)
GFR: 69.64 mL/min (ref >60.00)
Sodium: 141 mEq/L (ref 135–145)

## 2012-04-10 LAB — LIPID PANEL
Cholesterol: 260 mg/dL — ABNORMAL HIGH (ref 0–200)
HDL: 40.9 mg/dL (ref >39.00)
Total CHOL/HDL Ratio: 6
Triglycerides: 188 mg/dL — ABNORMAL HIGH (ref 0.0–149.0)
VLDL: 37.6 mg/dL (ref 0.0–40.0)

## 2012-04-10 LAB — CBC WITH DIFFERENTIAL/PLATELET
Basophils Absolute: 0.1 10*3/uL (ref 0.0–0.1)
Basophils Relative: 0.9 % (ref 0.0–3.0)
Eosinophils Absolute: 0.2 10*3/uL (ref 0.0–0.7)
Lymphocytes Relative: 11.5 % — ABNORMAL LOW (ref 12.0–46.0)
MCHC: 32.8 g/dL (ref 30.0–36.0)
Monocytes Relative: 10 % (ref 3.0–12.0)
Neutrophils Relative %: 74.8 % (ref 43.0–77.0)
RBC: 4.46 Mil/uL (ref 3.87–5.11)
WBC: 7.9 10*3/uL (ref 4.5–10.5)

## 2012-04-10 LAB — HEPATIC FUNCTION PANEL
ALT: 18 U/L (ref 0–35)
Alkaline Phosphatase: 57 U/L (ref 39–117)
Bilirubin, Direct: 0.1 mg/dL (ref 0.0–0.3)
Total Bilirubin: 0.8 mg/dL (ref 0.3–1.2)

## 2012-04-10 LAB — LDL CHOLESTEROL, DIRECT: Direct LDL: 183.5 mg/dL

## 2012-04-10 MED ORDER — TRAMADOL HCL 50 MG PO TABS: 50.0000 mg | ORAL_TABLET | Freq: Three times a day (TID) | ORAL | Status: DC | PRN

## 2012-04-10 MED ORDER — PREDNISONE (PAK) 5 MG PO TABS: 5.0000 mg | ORAL_TABLET | Freq: Every day | ORAL | Status: DC

## 2012-04-10 MED ORDER — MONTELUKAST SODIUM 10 MG PO TABS: 10.0000 mg | ORAL_TABLET | Freq: Every day | ORAL | Status: AC

## 2012-04-10 NOTE — Addendum Note (Signed)
Addended by: Sueanne Margarita on: 04/10/2012 09:48 AM   Modules accepted: Orders

## 2012-04-10 NOTE — Progress Notes (Signed)
Subjective:    Patient ID: Katrina Andrews, female    DOB: 02-Jul-1945, 66 y.o.   MRN: [id number]  HPI Here for physical Moving to [facility] and cousin are in that area Only gets cough once in a while at night ?allergies--gets some maxillary congestion also Cough occurs at times after arising after bending  Weight is down a little Did get seen at CHF clinic--no change. Just monitoring weight  Dr Gavin Potters is considering enbrel Seronegative inflammatory arthritis Sparing ibuprofen, prednisone only for flares Ongoing back and knee pain Trouble getting out of a chair at times Shoulders are better after PT  Mood has been good Still on the medication  Current Outpatient Prescriptions on File Prior to Visit  Medication Sig Dispense Refill  . cetirizine (ZYRTEC) 10 MG tablet Take 10 mg by mouth at bedtime.        . folic acid (FOLVITE) 1 MG tablet Take 1 mg by mouth daily.       . furosemide (LASIX) 40 MG tablet Take 2 tablets (80 mg total) by mouth daily. Take second one if home weight above 238#  60 tablet  11  . ketoconazole (NIZORAL) 2 % cream Apply 1 application topically daily as needed. For dry skin       . Melatonin 5 MG CAPS Take 5 mg by mouth at bedtime as needed. For sleep      . methotrexate (RHEUMATREX) 2.5 MG tablet Take 2.5 mg by mouth once a week.       Marland Kitchen PARoxetine (PAXIL) 20 MG tablet Take 1 tablet (20 mg total) by mouth daily.  30 tablet  11  . potassium chloride (K-DUR,KLOR-CON) 10 MEQ tablet Take 10 mEq by mouth daily.       Marland Kitchen rOPINIRole (REQUIP) 1 MG tablet Take 1 mg by mouth at bedtime.       . [DISCONTINUED] albuterol (VENTOLIN HFA) 108 (90 BASE) MCG/ACT inhaler Inhale 2 puffs into the lungs every 6 (six) hours as needed for wheezing.  1 Inhaler  1  . [DISCONTINUED] furosemide (LASIX) 40 MG tablet Take 1 tablet (40 mg total) by mouth daily.  30 tablet  0  . [DISCONTINUED] furosemide (LASIX) 40 MG tablet Take 1 tablet (40 mg total) by mouth 2 (two)  times daily. Take second one if home weight above 238#  60 tablet  3  . [DISCONTINUED] montelukast (SINGULAIR) 10 MG tablet Take 1 tablet (10 mg total) by mouth at bedtime.  30 tablet  11    No Known Allergies  Past Medical History  Diagnosis Date  . Allergy   . Depression   . Diverticulitis     colon  . Hyperlipidemia   . Hypertension   . Ovarian cyst   . Urinary incontinence   . Obesity   . Chronic diastolic heart failure     Past Surgical History  Procedure Date  . Cholecystectomy   . Abdominal hysterectomy   . Incontinence surgery 03/2005  . Ankle fracture surgery 1983    left, screw  . Vaginal delivery     x3  . Knee cartilage surgery 2000    torn, left knee no surgery  . Dobutamine stress echo 09/1995  . Laparoscopic gastric banding 09/2007  . Carpal tunnel release 1/12    Dr Hyacinth Meeker    Family History  Problem Relation Age of Onset  . COPD Mother   . Bone cancer Father   . Testicular cancer Brother   . Parkinsonism Paternal Aunt   .  Diabetes Neg Hx     History   Social History  . Marital Status: Legally Separated    Spouse Name: N/A    Number of Children: 3  . Years of Education: N/A   Occupational History  . retired Engineer, civil (consulting)   . 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 part-time at [employer]    Social History Main Topics  . Smoking status: Former Smoker    Types: Cigarettes    Quit date: 06/06/1984  . Smokeless tobacco: Never Used  . Alcohol Use: No  . Drug Use: No  . Sexually Active: Not on file   Other Topics Concern  . Not on file   Social History Narrative  . No narrative on file   Review of Systems  Constitutional: Positive for chills. Negative for unexpected weight change.       Wears seat belt  HENT: Positive for hearing loss, congestion and rhinorrhea. Negative for dental problem and tinnitus.        Mild right hearing loss Regular with dentist Uses zyrtec at night  Eyes: Negative for visual disturbance.       No  diplopia or unilateral vision loss Recent exam shows stable vision  Respiratory: Positive for cough. Negative for chest tightness and stridor.        Gets easy diaphoresis with exertion--no CP or dyspnea though  Cardiovascular: Positive for leg swelling. Negative for chest pain and palpitations.       Occ edema in evening  Gastrointestinal: Negative for nausea, vomiting, abdominal pain, constipation and blood in stool.       No heartburn  Genitourinary: Positive for urgency. Negative for dysuria, frequency and difficulty urinating.       Urge incontinence---uses pad No sex--no problem  Musculoskeletal: Positive for back pain, joint swelling and arthralgias.  Skin: Negative for rash.       Dry skin  Neurological: Positive for weakness. Negative for dizziness, syncope, light-headedness, numbness and headaches.       Hand weakness from arthritis  Hematological: Positive for adenopathy. Bruises/bleeds easily.       ?glands on left neck  Psychiatric/Behavioral: Positive for sleep disturbance. Negative for dysphoric mood. The patient is not nervous/anxious.        Occ problems initiating sleep---tramadol will help if achy       Objective:   Physical Exam  Constitutional: She is oriented to person, place, and time. She appears well-developed and well-nourished. No distress.  HENT:  Head: Normocephalic and atraumatic.  Right Ear: External ear normal.  Left Ear: External ear normal.  Mouth/Throat: Oropharynx is clear and moist. No oropharyngeal exudate.  Eyes: Conjunctivae normal and EOM are normal. Pupils are equal, round, and reactive to light.  Neck: Normal range of motion. Neck supple. No thyromegaly present.  Cardiovascular: Normal rate, regular rhythm, normal heart sounds and intact distal pulses.  Exam reveals no gallop.   No murmur heard. Pulmonary/Chest: Effort normal and breath sounds normal. No respiratory distress. She has no wheezes. She has no rales.  Abdominal: Soft. There  is no tenderness.  Genitourinary:       No breast masses or tenderness  Musculoskeletal: She exhibits no edema and no tenderness.       No active synovitis in hands today  Lymphadenopathy:    She has no cervical adenopathy.  Neurological: She is alert and oriented to person, place, and time.  Skin: Skin is dry. No rash noted. No erythema.  Psychiatric: She has  a normal mood and affect. Her behavior is normal.          Assessment & Plan:

## 2012-04-10 NOTE — Assessment & Plan Note (Signed)
Stable functional and fluid status No changes Due for labs

## 2012-04-10 NOTE — Assessment & Plan Note (Signed)
Mood is good Looking forward to move Continue the med

## 2012-04-10 NOTE — Assessment & Plan Note (Signed)
Due for mammo Due for colonoscopy but moving---will do stool immunoassay Flu shot and pneumovax Rx for zostavax

## 2012-04-10 NOTE — Assessment & Plan Note (Signed)
BP Readings from Last 3 Encounters:  04/10/12 130/80  01/05/12 128/72  12/12/11 140/80   Good control

## 2012-04-10 NOTE — Assessment & Plan Note (Signed)
Dr Gavin Potters treating Seems controlled on MTX but considering enbrel

## 2012-04-13 ENCOUNTER — Encounter: Payer: Self-pay | Admitting: *Deleted

## 2012-04-16 ENCOUNTER — Other Ambulatory Visit: Payer: Self-pay | Admitting: *Deleted

## 2012-04-16 NOTE — Telephone Encounter (Signed)
Ok to refill? We have never filled for patient

## 2012-04-17 MED ORDER — ROPINIROLE HCL 1 MG PO TABS: 1.0000 mg | ORAL_TABLET | Freq: Every day | ORAL | Status: DC

## 2012-04-17 NOTE — Telephone Encounter (Signed)
That is fine Okay to refill for a year

## 2012-04-17 NOTE — Telephone Encounter (Signed)
rx sent to pharmacy by e-script  

## 2012-04-19 ENCOUNTER — Telehealth: Payer: Self-pay

## 2012-04-19 NOTE — Telephone Encounter (Signed)
Pt request results recent labs. Patient notified as instructed by telephone. Pt advised copy sent to Dr Lavenia Atlas and copy sent to pts home address. Pt voiced understanding.

## 2012-05-25 ENCOUNTER — Ambulatory Visit (INDEPENDENT_AMBULATORY_CARE_PROVIDER_SITE_OTHER): Payer: 59 | Admitting: *Deleted

## 2012-05-25 DIAGNOSIS — Z23 Encounter for immunization: Secondary | ICD-10-CM

## 2012-05-25 DIAGNOSIS — Z2911 Encounter for prophylactic immunotherapy for respiratory syncytial virus (RSV): Secondary | ICD-10-CM

## 2012-06-07 ENCOUNTER — Telehealth (INDEPENDENT_AMBULATORY_CARE_PROVIDER_SITE_OTHER): Payer: Self-pay | Admitting: Surgery

## 2012-06-07 NOTE — Telephone Encounter (Signed)
04/27/12 mailed recall letter for bariatric surgery follow-up to pt. Advised pt to call [facility] at [phone] to °schedule appt. (lss) ° °

## 2012-06-12 ENCOUNTER — Other Ambulatory Visit: Payer: Self-pay

## 2012-06-12 MED ORDER — POTASSIUM CHLORIDE CRYS ER 10 MEQ PO TBCR: 20.0000 meq | EXTENDED_RELEASE_TABLET | Freq: Every day | ORAL | Status: DC

## 2012-06-12 NOTE — Telephone Encounter (Signed)
Mandy at [facility], [place] left v/m; request refill Potassium 10 meq taking two tablets daily # 60. Med list has listed as potassium 10 meq taking one daily.Pt verified she does take potassium 10 meq two tablets daily.Please advise.

## 2012-06-12 NOTE — Telephone Encounter (Signed)
rx sent to pharmacy by e-script Med list corrected 

## 2012-06-12 NOTE — Telephone Encounter (Signed)
Okay to refill as 2 daily #60 x 2 She will need to get new physician in that area

## 2013-01-09 IMAGING — CR DG CHEST 1V PORT
1 series · 1 of 1 positions shown · non-contrast
Comparison: 01/20/2011, [HOSPITAL]

CLINICAL DATA: Shortness of breath

PORTABLE CHEST - 1 VIEW

[view not recorded]
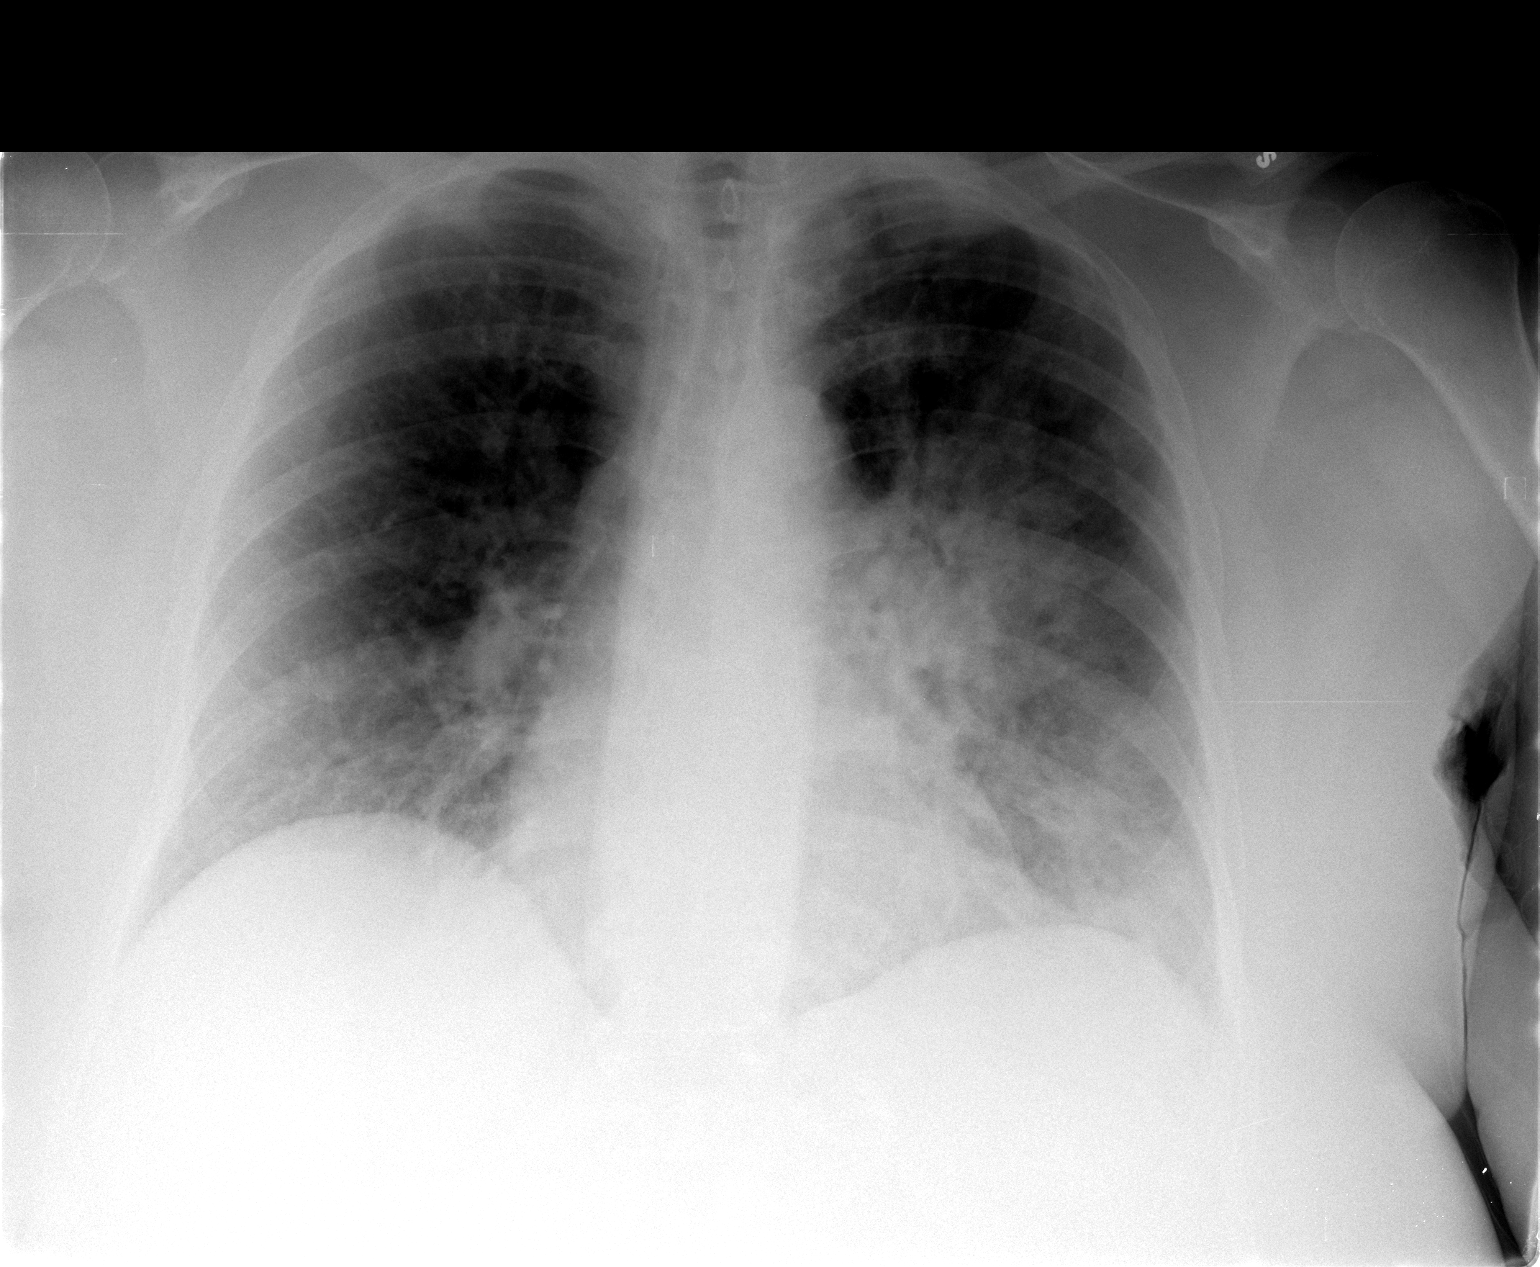

[1 of 1 positions shown; findings below may reference images not displayed]

FINDINGS: Shallow inspiration.  Interval development of focal
airspace infiltration in the left mid lung suggesting pneumonia.
No blunting of costophrenic angles.  No pneumothorax.  Normal heart
size and pulmonary vascularity.
IMPRESSION: Interval development of focal airspace consolidation in the left
mid lung suggesting pneumonia.

## 2013-02-22 ENCOUNTER — Encounter: Payer: Self-pay | Admitting: Internal Medicine

## 2013-02-22 ENCOUNTER — Ambulatory Visit (HOSPITAL_COMMUNITY)
Admission: RE | Admit: 2013-02-22 | Discharge: 2013-02-22 | Disposition: A | Discharge status: Home / Self Care | Payer: Medicare Other | Source: Ambulatory Visit | Attending: Cardiology | Admitting: Cardiology

## 2013-02-22 VITALS — BP 126/82 | HR 74 | Wt 229.5 lb

## 2013-02-22 DIAGNOSIS — R059 Cough, unspecified: Secondary | ICD-10-CM | POA: Insufficient documentation

## 2013-02-22 DIAGNOSIS — I10 Essential (primary) hypertension: Secondary | ICD-10-CM | POA: Insufficient documentation

## 2013-02-22 DIAGNOSIS — R05 Cough: Secondary | ICD-10-CM

## 2013-02-22 DIAGNOSIS — G4733 Obstructive sleep apnea (adult) (pediatric): Secondary | ICD-10-CM | POA: Insufficient documentation

## 2013-02-22 DIAGNOSIS — I5032 Chronic diastolic (congestive) heart failure: Secondary | ICD-10-CM | POA: Insufficient documentation

## 2013-02-22 MED ORDER — POTASSIUM CHLORIDE ER 10 MEQ PO TBCR: 20.0000 meq | EXTENDED_RELEASE_TABLET | Freq: Every day | ORAL | Status: DC

## 2013-02-22 MED ORDER — FUROSEMIDE 40 MG PO TABS: 80.0000 mg | ORAL_TABLET | Freq: Every day | ORAL | Status: DC

## 2013-02-22 NOTE — Progress Notes (Signed)
Patient ID: Katrina Andrews, female   DOB: 1946-02-27, 67 y.o.   MRN: [id number]  HPI:  Katrina Andrews is a 67 y/o woman with a h/o obesity (previously on Phen-fen. S/p Lap Band 2009), HTN, rheumatoid arthritis, OSA (non-compliant with CPAP), diastolic HF and dyspnea. She had a Myoview in 2008 which is normal.  We saw her previously to further evaluate nighttime cough and dyspnea. . Admitted to [facility] in Nov 2012 with LLL PNA and diastolic HF. pBNP 1900. Was diuresed and treated with abx with about 90% recovery in her cough. After her hospitalization cough returned to a point. Weight up from 237 to 246 and diuretics increased. Echo in 11/12 EF 50-55% trivial TR. RV normal. No comment on diastolic parameters. Repeat BNP in November 2012 was normal.   Returns for f/u. Started Enbrel in February. Has helped her RA a lot. Cough improved. Says she notices more when she lays down at night or eats a lot of salt. Feels that Flonase really helps. Has lost 15 pounds. Denies dyspnea. But says when she walks a long distance her kids say she is breathing heavy. If weight goes up she takes water pill. No edema. No CP. Off al BP meds. Denies reflux symptoms. Doesn't take potassium with lasix. Not wearing CPAP but wants to try again.    Past Medical History  Diagnosis Date  . Allergy   . Depression   . Diverticulitis     colon  . Hyperlipidemia   . Hypertension   . Ovarian cyst   . Urinary incontinence   . Obesity   . Chronic diastolic heart failure     Current Outpatient Prescriptions  Medication Sig Dispense Refill  . albuterol (PROVENTIL HFA;VENTOLIN HFA) 108 (90 BASE) MCG/ACT inhaler Inhale 2 puffs into the lungs every 6 (six) hours as needed.      . cetirizine (ZYRTEC) 10 MG tablet Take 10 mg by mouth at bedtime.        Marland Kitchen etanercept (ENBREL) 50 MG/ML injection Inject 50 mg into the skin once a week.      . folic acid (FOLVITE) 1 MG tablet Take 1 mg by mouth daily.       . furosemide (LASIX) 40 MG tablet  Take 80 mg by mouth daily. Take third one if home weight above 230#      . ibuprofen (ADVIL,MOTRIN) 200 MG tablet Take 200 mg by mouth every 6 (six) hours as needed for pain.      Marland Kitchen ketoconazole (NIZORAL) 2 % cream Apply 1 application topically daily as needed. For dry skin       . Melatonin 5 MG CAPS Take 5 mg by mouth at bedtime as needed. For sleep      . methotrexate (RHEUMATREX) 2.5 MG tablet Take 2.5 mg by mouth once a week.       . montelukast (SINGULAIR) 10 MG tablet Take 1 tablet (10 mg total) by mouth at bedtime.  90 tablet  3  . PARoxetine (PAXIL) 20 MG tablet Take 1 tablet (20 mg total) by mouth daily.  30 tablet  11  . rOPINIRole (REQUIP) 1 MG tablet Take 1 tablet (1 mg total) by mouth at bedtime.  90 tablet  3   No current facility-administered medications for this encounter.     No Known Allergies  PHYSICAL EXAM: Filed Vitals:   02/22/13 0929  BP: 126/82  Pulse: 74   General:  Well appearing. No respiratory difficulty HEENT: normal Neck: supple. no JVD. Carotids  2+ bilat; no bruits. No lymphadenopathy or thryomegaly appreciated. Cor: PMI nondisplaced. Regular rate & rhythm. No rubs, gallops or murmurs. Lungs: clear Abdomen: obese soft, nontender, nondistended. No hepatosplenomegaly. No bruits or masses. Good bowel sounds. Extremities: no cyanosis, clubbing, rash, edema Neuro: alert & oriented x 3, cranial nerves grossly intact. moves all 4 extremities w/o difficulty. Affect pleasant.   ECG: NSR 74. No ST-T wave abnormalities.    ASSESSMENT & PLAN:  1. Chronic diastolic HF    --Doing well with sliding scale lasix. Stressed need to take KCL with lasix. Will check labs today. Reviewed use of sliding scale diuretics and fluid restriction. 2. HTN    --well-controlled 3. Cough     --improved . Likely multifactorial. I suspect she may have component of GERD. Can start PPI as needed 4. Obesity     --continue weight loss efforts with diet and exercise  5. OSA      --refer back for repeat sleep study  Castin Donaghue,MD 10:10 AM

## 2013-02-22 NOTE — Patient Instructions (Addendum)
Labs today  Start K-dur (Potassium) 10 meq 2 tabs daily  Discuss sleep study with MD in [place]  We will contact you in 1 year to schedule your next appointment.

## 2013-02-22 NOTE — Addendum Note (Signed)
Encounter addended by: Noralee Space, RN on: 02/22/2013 10:26 AM<BR>     Documentation filed: Patient Instructions Section, Orders

## 2013-02-25 ENCOUNTER — Encounter: Payer: Self-pay | Admitting: Internal Medicine

## 2013-11-05 ENCOUNTER — Other Ambulatory Visit: Payer: Self-pay

## 2013-11-05 MED ORDER — FUROSEMIDE 40 MG PO TABS: 80.0000 mg | ORAL_TABLET | Freq: Every day | ORAL | Status: DC

## 2013-11-11 ENCOUNTER — Other Ambulatory Visit (HOSPITAL_COMMUNITY): Payer: Self-pay | Admitting: *Deleted

## 2013-11-11 MED ORDER — FUROSEMIDE 40 MG PO TABS: 80.0000 mg | ORAL_TABLET | Freq: Every day | ORAL | Status: DC

## 2014-03-06 ENCOUNTER — Ambulatory Visit (HOSPITAL_COMMUNITY)
Admission: RE | Admit: 2014-03-06 | Discharge: 2014-03-06 | Disposition: A | Discharge status: Home / Self Care | Payer: Medicare Other | Source: Ambulatory Visit | Attending: Internal Medicine | Admitting: Internal Medicine

## 2014-03-06 DIAGNOSIS — N8320 Unspecified ovarian cysts: Secondary | ICD-10-CM | POA: Diagnosis not present

## 2014-03-06 DIAGNOSIS — I5032 Chronic diastolic (congestive) heart failure: Secondary | ICD-10-CM

## 2014-03-06 DIAGNOSIS — Z79899 Other long term (current) drug therapy: Secondary | ICD-10-CM | POA: Insufficient documentation

## 2014-03-06 DIAGNOSIS — G4733 Obstructive sleep apnea (adult) (pediatric): Secondary | ICD-10-CM | POA: Insufficient documentation

## 2014-03-06 DIAGNOSIS — Z9119 Patient's noncompliance with other medical treatment and regimen: Secondary | ICD-10-CM | POA: Insufficient documentation

## 2014-03-06 DIAGNOSIS — R0600 Dyspnea, unspecified: Secondary | ICD-10-CM | POA: Diagnosis not present

## 2014-03-06 DIAGNOSIS — R32 Unspecified urinary incontinence: Secondary | ICD-10-CM | POA: Diagnosis not present

## 2014-03-06 DIAGNOSIS — I10 Essential (primary) hypertension: Secondary | ICD-10-CM

## 2014-03-06 DIAGNOSIS — E785 Hyperlipidemia, unspecified: Secondary | ICD-10-CM | POA: Insufficient documentation

## 2014-03-06 DIAGNOSIS — K5732 Diverticulitis of large intestine without perforation or abscess without bleeding: Secondary | ICD-10-CM | POA: Insufficient documentation

## 2014-03-06 DIAGNOSIS — F329 Major depressive disorder, single episode, unspecified: Secondary | ICD-10-CM | POA: Diagnosis not present

## 2014-03-06 DIAGNOSIS — M069 Rheumatoid arthritis, unspecified: Secondary | ICD-10-CM | POA: Insufficient documentation

## 2014-03-06 DIAGNOSIS — Z9884 Bariatric surgery status: Secondary | ICD-10-CM | POA: Diagnosis not present

## 2014-03-06 DIAGNOSIS — E669 Obesity, unspecified: Secondary | ICD-10-CM | POA: Diagnosis not present

## 2014-03-06 NOTE — Addendum Note (Signed)
Encounter addended by: Scarlette Calico, RN on: 03/06/2014 12:28 PM<BR>     Documentation filed: Patient Instructions Section, Care Teams

## 2014-03-06 NOTE — Progress Notes (Signed)
Patient ID: Mana Haberl, female   DOB: March 07, 1946, 68 y.o.   MRN: [id number]  PCP: Miles Costain [phone] Lorane Gell)   HPI:  Bindi is a 68 y/o woman with a h/o obesity (previously on Phen-fen. S/p Lap Band 2009), HTN, rheumatoid arthritis, OSA (non-compliant with CPAP), diastolic HF and dyspnea. She had a Myoview in 2008 which was normal.  Admitted to [facility] in Nov 2012 with LLL PNA and diastolic HF. pBNP 1900. Was diuresed and treated with abx with about 90% recovery in her cough. After her hospitalization cough returned to a point. Weight up from 237 to 246 and diuretics increased. Echo in 11/12 EF 50-55% trivial TR. RV normal. No comment on diastolic parameters. Repeat BNP in November 2012 was normal.   Returns for f/u. Doing much better. Exercising with trainer 2-3x/week including TM at 3.4 mph and 2-3% grade. No CP or undue SOB. No edema. Enbrel helping with RA tremendously. BP well controlled. Weight down 25 pounds in 2 years. Remains on lasix 40 bid. Occasionally takes extra as needed.    Past Medical History  Diagnosis Date  . Allergy   . Depression   . Diverticulitis     colon  . Hyperlipidemia   . Hypertension   . Ovarian cyst   . Urinary incontinence   . Obesity   . Chronic diastolic heart failure     Current Outpatient Prescriptions  Medication Sig Dispense Refill  . albuterol (PROVENTIL HFA;VENTOLIN HFA) 108 (90 BASE) MCG/ACT inhaler Inhale 2 puffs into the lungs every 6 (six) hours as needed.      . cetirizine (ZYRTEC) 10 MG tablet Take 10 mg by mouth at bedtime.        Marland Kitchen etanercept (ENBREL) 50 MG/ML injection Inject 50 mg into the skin once a week.      . folic acid (FOLVITE) 1 MG tablet Take 1 mg by mouth daily.       . furosemide (LASIX) 40 MG tablet Take 2 tablets (80 mg total) by mouth daily. Take third one if home weight above 230#  225 tablet  3  . ibuprofen (ADVIL,MOTRIN) 200 MG tablet Take 200 mg by mouth every 6 (six) hours as needed for pain.       Marland Kitchen ketoconazole (NIZORAL) 2 % cream Apply 1 application topically daily as needed. For dry skin       . Melatonin 5 MG CAPS Take 5 mg by mouth at bedtime as needed. For sleep      . methotrexate (RHEUMATREX) 2.5 MG tablet Take 2.5 mg by mouth once a week.       . montelukast (SINGULAIR) 10 MG tablet Take 1 tablet (10 mg total) by mouth at bedtime.  90 tablet  3  . PARoxetine (PAXIL) 20 MG tablet Take 1 tablet (20 mg total) by mouth daily.  30 tablet  11  . potassium chloride (K-DUR) 10 MEQ tablet Take 2 tablets (20 mEq total) by mouth daily.  180 tablet  3  . rOPINIRole (REQUIP) 1 MG tablet Take 1 tablet (1 mg total) by mouth at bedtime.  90 tablet  3   No current facility-administered medications for this encounter.     No Known Allergies  PHYSICAL EXAM: There were no vitals filed for this visit. General:  Well appearing. No respiratory difficulty HEENT: normal Neck: supple. no JVD. Carotids 2+ bilat; no bruits. No lymphadenopathy or thryomegaly appreciated. Cor: PMI nondisplaced. Regular rate & rhythm. No rubs, gallops or murmurs. Lungs: clear Abdomen:  obese soft, nontender, nondistended. No hepatosplenomegaly. No bruits or masses. Good bowel sounds. Extremities: no cyanosis, clubbing, rash, edema Neuro: alert & oriented x 3, cranial nerves grossly intact. moves all 4 extremities w/o difficulty. Affect pleasant.   ECG: NSR 74. No ST-T wave abnormalities.    ASSESSMENT & PLAN:  1. Chronic diastolic HF    --Doing extremely well. More active. Volume status looks great.     -- Doing well with sliding scale lasix. Stressed need to take KCL with lasix.  2. HTN    --well-controlled 3. Obesity     --continues to lose weight. Congratulated her on the efforts  F/u 1 year.    Thorsten Climer,MD 12:17 PM

## 2014-03-06 NOTE — Patient Instructions (Signed)
We will contact you in 1 year to schedule your next appointment.  

## 2014-07-07 ENCOUNTER — Other Ambulatory Visit (HOSPITAL_COMMUNITY): Payer: Self-pay | Admitting: Cardiology

## 2014-07-07 ENCOUNTER — Other Ambulatory Visit (HOSPITAL_COMMUNITY): Payer: Self-pay

## 2014-07-07 MED ORDER — FUROSEMIDE 40 MG PO TABS: 80.0000 mg | ORAL_TABLET | Freq: Every day | ORAL | Status: DC

## 2015-04-21 ENCOUNTER — Encounter (HOSPITAL_COMMUNITY): Payer: Medicare Other | Admitting: Internal Medicine

## 2015-05-07 ENCOUNTER — Ambulatory Visit (HOSPITAL_COMMUNITY)
Admission: RE | Admit: 2015-05-07 | Discharge: 2015-05-07 | Disposition: A | Discharge status: Home / Self Care | Payer: Medicare Other | Source: Ambulatory Visit | Attending: Internal Medicine | Admitting: Internal Medicine

## 2015-05-07 VITALS — BP 136/78 | HR 79 | Wt 220.5 lb

## 2015-05-07 DIAGNOSIS — I110 Hypertensive heart disease with heart failure: Secondary | ICD-10-CM | POA: Diagnosis not present

## 2015-05-07 DIAGNOSIS — I10 Essential (primary) hypertension: Secondary | ICD-10-CM

## 2015-05-07 DIAGNOSIS — I5032 Chronic diastolic (congestive) heart failure: Secondary | ICD-10-CM

## 2015-05-07 DIAGNOSIS — E669 Obesity, unspecified: Secondary | ICD-10-CM | POA: Diagnosis not present

## 2015-05-07 NOTE — Addendum Note (Signed)
Encounter addended by: Scarlette Calico, RN on: 05/07/2015  3:41 PM<BR>     Documentation filed: Patient Instructions Section

## 2015-05-07 NOTE — Progress Notes (Signed)
ADVANCED HF CLINIC NOTE  Patient ID: Katrina Andrews, female   DOB: May 18, 1946, 69 y.o.   MRN: [id number]  PCP: Ethelene Hal PA-C [facility] Manus Gunning)  HPI:  Katrina Andrews is a 69 y/o woman with a h/o obesity (previously on Phen-fen. S/p Lap Band 2009), HTN, rheumatoid arthritis, OSA (non-compliant with CPAP), diastolic HF and dyspnea. She had a Myoview in 2008 which was normal.  Admitted to [facility] in Nov 2012 with LLL PNA and diastolic HF. pBNP 1900. Was diuresed and treated with abx with about 90% recovery in her cough. After her hospitalization cough returned to a point. Weight up from 237 to 246 and diuretics increased. Echo in 11/12 EF 50-55% trivial TR. RV normal. No comment on diastolic parameters. Repeat BNP in November 2012 was normal.   Returns for f/u. Doing fine. Was doing yoga and silver sneakers 2x/week but has not been doing that for 1-2 months. Says she has been very busy.  No CP or undue SOB. Mild edema during day but goes down over night. Getting CPAP recalibrated this week. On Enbrel and MTX for RA. BP well controlled. Weight down 10 pounds from last year. Remains on lasix 80 daily. Occasionally takes 40 as needed in the afternoon. Getting 8-10k steps on FitBit per day.    Past Medical History  Diagnosis Date  . Allergy   . Depression   . Diverticulitis     colon  . Hyperlipidemia   . Hypertension   . Ovarian cyst   . Urinary incontinence   . Obesity   . Chronic diastolic heart failure     Current Outpatient Prescriptions  Medication Sig Dispense Refill  . albuterol (PROVENTIL HFA;VENTOLIN HFA) 108 (90 BASE) MCG/ACT inhaler Inhale 2 puffs into the lungs every 6 (six) hours as needed for wheezing or shortness of breath.     . Ascorbic Acid (VITAMIN C ADULT GUMMIES) 125 MG CHEW Chew 250 mg by mouth daily.    . B Complex Vitamins (B COMPLEX 1 PO) Take 3 Units by mouth daily.    . cetirizine (ZYRTEC) 10 MG tablet Take 10 mg by mouth at bedtime.      Marland Kitchen etanercept (ENBREL) 50 MG/ML  injection Inject 50 mg into the skin every 14 (fourteen) days.     . fluticasone (FLONASE) 50 MCG/ACT nasal spray Place 1 spray into both nostrils daily as needed.  5  . folic acid (FOLVITE) 1 MG tablet Take 1 mg by mouth daily.     . furosemide (LASIX) 40 MG tablet Take 2 tablets (80 mg total) by mouth daily. Take third one if home weight above 230# 225 tablet 3  . ibuprofen (ADVIL,MOTRIN) 200 MG tablet Take 200 mg by mouth every 6 (six) hours as needed for pain.    . methotrexate (RHEUMATREX) 2.5 MG tablet Take 10 mg by mouth once a week.     . montelukast (SINGULAIR) 10 MG tablet Take 1 tablet (10 mg total) by mouth at bedtime. 90 tablet 3  . Multiple Vitamins-Minerals (EMERGEN-C VITAMIN C PO) Take 1 tablet by mouth daily as needed (immunity).    . Omega-3 1000 MG CAPS Take 2,000 mg by mouth daily.    Marland Kitchen omeprazole (PRILOSEC) 20 MG capsule Take 20 mg by mouth 2 (two) times daily.    . potassium chloride (K-DUR) 10 MEQ tablet Take 2 tablets (20 mEq total) by mouth daily. 180 tablet 3  . rOPINIRole (REQUIP) 1 MG tablet Take 1 tablet (1 mg total) by mouth  at bedtime. 90 tablet 3   No current facility-administered medications for this encounter.     Allergies  Allergen Reactions  . Diclofenac Other (See Comments)    Retains fluid    PHYSICAL EXAM: Filed Vitals:   05/07/15 1452  BP: 136/78  Pulse: 79   General:  Well appearing. No respiratory difficulty HEENT: normal Neck: supple. no JVD. Carotids 2+ bilat; no bruits. No lymphadenopathy or thryomegaly appreciated. Cor: PMI nondisplaced. Regular rate & rhythm. No rubs, gallops or murmurs. Lungs: clear Abdomen: obese soft, nontender, nondistended. No hepatosplenomegaly. No bruits or masses. Good bowel sounds. Extremities: no cyanosis, clubbing, rash, tr edema Neuro: alert & oriented x 3, cranial nerves grossly intact. moves all 4 extremities w/o difficulty. Affect pleasant.   ASSESSMENT & PLAN:  1. Chronic diastolic HF    --Doing  well. Volume status looks well     -- Doing well with sliding scale lasix.     -- Had bloodwork today at [facility]. She will forward to [place].  2. HTN    --well-controlled 3. Obesity     --continues to lose weight. Encouraged her to restart exercise program.   F/u 1 year.    Bensimhon, Daniel,MD 3:30 PM

## 2015-05-07 NOTE — Patient Instructions (Signed)
We will contact you in 1 year to schedule your next appointment.  

## 2015-05-07 NOTE — Progress Notes (Signed)
Advanced Heart Failure Medication Review by a Pharmacist  Does the patient  feel that his/her medications are working for him/her?  yes  Has the patient been experiencing any side effects to the medications prescribed?  no  Does the patient measure his/her own blood pressure or blood glucose at home?  yes   Does the patient have any problems obtaining medications due to transportation or finances?   no  Understanding of regimen: good Understanding of indications: good Potential of compliance: good Patient understands to avoid NSAIDs. Patient understands to avoid decongestants.  Issues to address at subsequent visits: None   Pharmacist comments:  Ms. Killeen is a pleasant 69 yo F presenting without her medication list but with great recall of her regimen including dosages. She reports excellent compliance and did not have any specific medication-related questions or concerns for me at this time.   Ruta Hinds. Velva Harman, PharmD, BCPS, CPP Clinical Pharmacist Pager: [phone] Phone: [phone] 05/07/2015 3:12 PM      Time with patient: 10 minutes Preparation and documentation time: 2 minutes Total time: 12 minutes

## 2015-06-11 ENCOUNTER — Other Ambulatory Visit (HOSPITAL_COMMUNITY): Payer: Self-pay | Admitting: *Deleted

## 2015-06-11 MED ORDER — POTASSIUM CHLORIDE ER 10 MEQ PO TBCR: 20.0000 meq | EXTENDED_RELEASE_TABLET | Freq: Every day | ORAL | Status: DC

## 2015-06-15 ENCOUNTER — Other Ambulatory Visit (HOSPITAL_COMMUNITY): Payer: Self-pay | Admitting: *Deleted

## 2015-07-12 ENCOUNTER — Other Ambulatory Visit (HOSPITAL_COMMUNITY): Payer: Self-pay | Admitting: Internal Medicine

## 2015-10-21 DIAGNOSIS — M5416 Radiculopathy, lumbar region: Secondary | ICD-10-CM | POA: Insufficient documentation

## 2015-11-12 ENCOUNTER — Other Ambulatory Visit (HOSPITAL_COMMUNITY): Payer: Self-pay | Admitting: Internal Medicine

## 2016-03-18 ENCOUNTER — Encounter (HOSPITAL_COMMUNITY): Payer: Self-pay

## 2016-03-21 ENCOUNTER — Other Ambulatory Visit (HOSPITAL_COMMUNITY): Payer: Self-pay | Admitting: Internal Medicine

## 2016-06-09 ENCOUNTER — Encounter (HOSPITAL_COMMUNITY): Payer: Medicare Other | Admitting: Internal Medicine

## 2016-07-06 ENCOUNTER — Encounter (HOSPITAL_COMMUNITY): Payer: Self-pay | Admitting: Internal Medicine

## 2016-07-06 ENCOUNTER — Ambulatory Visit (HOSPITAL_COMMUNITY)
Admission: RE | Admit: 2016-07-06 | Discharge: 2016-07-06 | Disposition: A | Discharge status: Home / Self Care | Payer: Medicare Other | Source: Ambulatory Visit | Attending: Internal Medicine | Admitting: Internal Medicine

## 2016-07-06 VITALS — BP 130/86 | HR 87 | Wt 227.0 lb

## 2016-07-06 DIAGNOSIS — E663 Overweight: Secondary | ICD-10-CM | POA: Diagnosis not present

## 2016-07-06 DIAGNOSIS — I110 Hypertensive heart disease with heart failure: Secondary | ICD-10-CM | POA: Insufficient documentation

## 2016-07-06 DIAGNOSIS — I5032 Chronic diastolic (congestive) heart failure: Secondary | ICD-10-CM | POA: Diagnosis present

## 2016-07-06 DIAGNOSIS — Z6841 Body Mass Index (BMI) 40.0 and over, adult: Secondary | ICD-10-CM | POA: Diagnosis not present

## 2016-07-06 DIAGNOSIS — E669 Obesity, unspecified: Secondary | ICD-10-CM | POA: Diagnosis not present

## 2016-07-06 DIAGNOSIS — Z7951 Long term (current) use of inhaled steroids: Secondary | ICD-10-CM | POA: Insufficient documentation

## 2016-07-06 DIAGNOSIS — G4733 Obstructive sleep apnea (adult) (pediatric): Secondary | ICD-10-CM | POA: Diagnosis not present

## 2016-07-06 DIAGNOSIS — I10 Essential (primary) hypertension: Secondary | ICD-10-CM | POA: Diagnosis not present

## 2016-07-06 DIAGNOSIS — Z888 Allergy status to other drugs, medicaments and biological substances status: Secondary | ICD-10-CM | POA: Diagnosis not present

## 2016-07-06 DIAGNOSIS — K219 Gastro-esophageal reflux disease without esophagitis: Secondary | ICD-10-CM | POA: Insufficient documentation

## 2016-07-06 DIAGNOSIS — E785 Hyperlipidemia, unspecified: Secondary | ICD-10-CM | POA: Diagnosis not present

## 2016-07-06 DIAGNOSIS — M069 Rheumatoid arthritis, unspecified: Secondary | ICD-10-CM | POA: Insufficient documentation

## 2016-07-06 DIAGNOSIS — Z9884 Bariatric surgery status: Secondary | ICD-10-CM | POA: Diagnosis not present

## 2016-07-06 DIAGNOSIS — Z79899 Other long term (current) drug therapy: Secondary | ICD-10-CM | POA: Diagnosis not present

## 2016-07-06 DIAGNOSIS — Z9119 Patient's noncompliance with other medical treatment and regimen: Secondary | ICD-10-CM | POA: Diagnosis not present

## 2016-07-06 NOTE — Patient Instructions (Signed)
We will contact you in 1 year to schedule your next appointment.  

## 2016-07-06 NOTE — Addendum Note (Signed)
Encounter addended by: Scarlette Calico, RN on: 07/06/2016  3:11 PM<BR>    Actions taken: Sign clinical note

## 2016-07-06 NOTE — Progress Notes (Signed)
Advanced Heart Failure Clinic Note   Patient ID: Katrina Andrews, female   DOB: May 24, 1946, 71 y.o.   MRN: [id number]  PCP: Ethelene Hal PA-C [facility] Gunning)  HPI:  Katrina Andrews is a 71 y/o woman with a h/o obesity (previously on Phen-fen. S/p Lap Band 2009), HTN, rheumatoid arthritis, OSA (non-compliant with CPAP), diastolic HF and dyspnea. She had a Myoview in 2008 which was normal.  Admitted to [facility] in Nov 2012 with LLL PNA and diastolic HF. pBNP 1900. Was diuresed and treated with abx with about 90% recovery in her cough. After her hospitalization cough returned to a point. Weight up from 237 to 246 and diuretics increased. Echo in 11/12 EF 50-55% trivial TR. RV normal. No comment on diastolic parameters. Repeat BNP in November 2012 was normal.   She returns today for regular yearly follow up. Feels good overall.  SBP typically 120-130s. Up 7 lbs from last visit. Wears fitbit daily, gets 5-8k steps per day. Having lap band removed this Friday with a lot of GERD due to shift in position.  RA has been stable.  On MTX alone for past month. Try to get/stay off Enbrel. Taking lasix 80 mg daily. Occasionally takes an extra 80 mg in the evening, 2-3 times a week. Trys to watch salt but goes over. Drinks < 2 L daily. Denies DOE or CP. Occasional lightheadedness with rapid standing but not marked or limiting.   Labs 05/2016 K 3.9, Creatinine 0.89  Past Medical History:  Diagnosis Date  . Allergy   . Chronic diastolic heart failure ([place])   . Depression   . Diverticulitis    colon  . Hyperlipidemia   . Hypertension   . Obesity   . Ovarian cyst   . Urinary incontinence     Current Outpatient Prescriptions  Medication Sig Dispense Refill  . albuterol (PROVENTIL HFA;VENTOLIN HFA) 108 (90 BASE) MCG/ACT inhaler Inhale 2 puffs into the lungs every 6 (six) hours as needed for wheezing or shortness of breath.     . Ascorbic Acid (VITAMIN C ADULT GUMMIES) 125 MG CHEW Chew 250 mg by mouth daily.    . B  Complex Vitamins (B COMPLEX 1 PO) Take 3 Units by mouth daily.    . cetirizine (ZYRTEC) 10 MG tablet Take 10 mg by mouth at bedtime.      Marland Kitchen etanercept (ENBREL) 50 MG/ML injection Inject 50 mg into the skin every 14 (fourteen) days.     . fluticasone (FLONASE) 50 MCG/ACT nasal spray Place 1 spray into both nostrils daily as needed.  5  . folic acid (FOLVITE) 1 MG tablet Take 1 mg by mouth daily.     . furosemide (LASIX) 40 MG tablet TAKE 2 TABLETS BY MOUTH EVERY DAY (TAKE ADDITIONAL TABLET IF HOME WEIGHT IS ABOVE 230 LBS) 225 tablet 3  . ibuprofen (ADVIL,MOTRIN) 200 MG tablet Take 200 mg by mouth every 6 (six) hours as needed for pain.    . methotrexate (RHEUMATREX) 2.5 MG tablet Take 10 mg by mouth once a week.     . montelukast (SINGULAIR) 10 MG tablet Take 1 tablet (10 mg total) by mouth at bedtime. 90 tablet 3  . Multiple Vitamins-Minerals (EMERGEN-C VITAMIN C PO) Take 1 tablet by mouth daily as needed (immunity).    . Omega-3 1000 MG CAPS Take 2,000 mg by mouth daily.    Marland Kitchen omeprazole (PRILOSEC) 20 MG capsule Take 20 mg by mouth 2 (two) times daily.    . potassium chloride (K-DUR)  10 MEQ tablet Take 2 tablets (20 mEq total) by mouth daily. 180 tablet 3  . rOPINIRole (REQUIP) 1 MG tablet Take 1 tablet (1 mg total) by mouth at bedtime. 90 tablet 3   No current facility-administered medications for this encounter.      Allergies  Allergen Reactions  . Diclofenac Other (See Comments)    Retains fluid     PHYSICAL EXAM: Vitals:   07/06/16 1428  BP: 130/86  Pulse: 87  SpO2: 98%  Weight: 227 lb (103 kg)   Wt Readings from Last 3 Encounters:  07/06/16 227 lb (103 kg)  05/07/15 220 lb 8 oz (100 kg)  02/22/13 229 lb 8 oz (104.1 kg)    General:  Well appearing.  HEENT: Normal Neck: supple. Thick, JVP difficult to assess. Carotids 2+ bilat; no bruits. No thyromegaly or nodule noted.  Cor: PMI nondisplaced. RRR. No M/G/R Lungs: CTAB, normal effort Abdomen: obese soft, NT, ND, no  HSM. No bruits or masses. +BS  Extremities: no cyanosis, clubbing, rash, No edema.  Neuro: alert & oriented x 3, cranial nerves grossly intact. moves all 4 extremities w/o difficulty. Affect pleasant.   ASSESSMENT & PLAN:  1. Chronic diastolic HF - Doing well overall. Volume status overall stable. Continue sliding scale diuretics - Reinforced fluid restriction to < 2 L daily, sodium restriction to less than 2000 mg daily, and the importance of daily weights.   2. HTN - Mildly elevated recently. Encouraged low salt diet.  3. Obesity - Needs to exercise and lose weight.   Continue yearly follow up. As needed with any symptoms.   Shirley Friar, PA-C  2:43 PM  Patient seen and examined with Oda Kilts, PA-C. We discussed all aspects of the encounter. I agree with the assessment and plan as stated above.   Overall stable. Volume status looks good. BP ok. Reinforced need for daily weights and reviewed use of sliding scale diuretics. Continue with efforts with diet and exercise.   Tamea Bai,MD 3:01 PM

## 2016-07-16 ENCOUNTER — Other Ambulatory Visit (HOSPITAL_COMMUNITY): Payer: Self-pay | Admitting: Internal Medicine

## 2016-08-07 ENCOUNTER — Other Ambulatory Visit (HOSPITAL_COMMUNITY): Payer: Self-pay | Admitting: Internal Medicine

## 2017-04-10 ENCOUNTER — Encounter (HOSPITAL_COMMUNITY): Payer: Self-pay

## 2017-07-28 ENCOUNTER — Other Ambulatory Visit (HOSPITAL_COMMUNITY): Payer: Self-pay | Admitting: Cardiology

## 2017-10-20 ENCOUNTER — Encounter (HOSPITAL_COMMUNITY): Payer: Self-pay | Admitting: Internal Medicine

## 2017-10-20 ENCOUNTER — Ambulatory Visit (HOSPITAL_COMMUNITY)
Admission: RE | Admit: 2017-10-20 | Discharge: 2017-10-20 | Disposition: A | Discharge status: Home / Self Care | Payer: Medicare Other | Source: Ambulatory Visit | Attending: Internal Medicine | Admitting: Internal Medicine

## 2017-10-20 VITALS — BP 144/86 | HR 91 | Wt 236.2 lb

## 2017-10-20 DIAGNOSIS — N83209 Unspecified ovarian cyst, unspecified side: Secondary | ICD-10-CM | POA: Insufficient documentation

## 2017-10-20 DIAGNOSIS — R0602 Shortness of breath: Secondary | ICD-10-CM | POA: Diagnosis not present

## 2017-10-20 DIAGNOSIS — Z79899 Other long term (current) drug therapy: Secondary | ICD-10-CM | POA: Diagnosis not present

## 2017-10-20 DIAGNOSIS — R05 Cough: Secondary | ICD-10-CM | POA: Insufficient documentation

## 2017-10-20 DIAGNOSIS — Z9884 Bariatric surgery status: Secondary | ICD-10-CM | POA: Diagnosis not present

## 2017-10-20 DIAGNOSIS — M069 Rheumatoid arthritis, unspecified: Secondary | ICD-10-CM | POA: Diagnosis not present

## 2017-10-20 DIAGNOSIS — Z9119 Patient's noncompliance with other medical treatment and regimen: Secondary | ICD-10-CM | POA: Insufficient documentation

## 2017-10-20 DIAGNOSIS — I5032 Chronic diastolic (congestive) heart failure: Secondary | ICD-10-CM | POA: Diagnosis not present

## 2017-10-20 DIAGNOSIS — E785 Hyperlipidemia, unspecified: Secondary | ICD-10-CM | POA: Insufficient documentation

## 2017-10-20 DIAGNOSIS — E669 Obesity, unspecified: Secondary | ICD-10-CM | POA: Diagnosis not present

## 2017-10-20 DIAGNOSIS — I110 Hypertensive heart disease with heart failure: Secondary | ICD-10-CM | POA: Insufficient documentation

## 2017-10-20 DIAGNOSIS — F329 Major depressive disorder, single episode, unspecified: Secondary | ICD-10-CM | POA: Diagnosis not present

## 2017-10-20 DIAGNOSIS — I10 Essential (primary) hypertension: Secondary | ICD-10-CM

## 2017-10-20 DIAGNOSIS — E663 Overweight: Secondary | ICD-10-CM

## 2017-10-20 DIAGNOSIS — G4733 Obstructive sleep apnea (adult) (pediatric): Secondary | ICD-10-CM | POA: Insufficient documentation

## 2017-10-20 DIAGNOSIS — I498 Other specified cardiac arrhythmias: Secondary | ICD-10-CM | POA: Diagnosis not present

## 2017-10-20 MED ORDER — SPIRONOLACTONE 25 MG PO TABS: 12.5000 mg | ORAL_TABLET | Freq: Every day | ORAL | 3 refills | Status: DC

## 2017-10-20 MED ORDER — POTASSIUM CHLORIDE ER 10 MEQ PO TBCR: 10.0000 meq | EXTENDED_RELEASE_TABLET | Freq: Every day | ORAL | 3 refills | Status: DC

## 2017-10-20 NOTE — Patient Instructions (Signed)
START taking Spironolactone 12.5 mg (0.5 Tablet) Once Daily  DECREASE Potassium to 10 mEq (1 Tablet)  Once Daily  Echocardiogram has been ordered for you at [facility].    Please have labs drawn in 1-2 weeks (bmet)  Follow up as needed.

## 2017-10-20 NOTE — Progress Notes (Signed)
Advanced Heart Failure Clinic Note   Patient ID: Katrina Andrews, female   DOB: [birth year], 72 y.o.   MRN: [id number]  PCP: Ethelene Hal PA-C [facility])  HPI:  Katrina Andrews is a 72 y/o woman with a h/o obesity (previously on Phen-fen. S/p Lap Band 2009), HTN, rheumatoid arthritis, OSA (non-compliant with CPAP), diastolic HF and dyspnea. She had a Myoview in 2008 which was normal.  Admitted to [facility] in Nov 2012 with LLL PNA and diastolic HF. pBNP 1900. Was diuresed and treated with abx with about 90% recovery in her cough. After her hospitalization cough returned to a point. Weight up from 237 to 246 and diuretics increased. Echo in 11/12 EF 50-55% trivial TR. RV normal. No comment on diastolic parameters. Repeat BNP in November 2012 was normal.   She returns today for regular yearly follow up. Had a fall in 2/19 and broke left shoulder. Now recovered but has limited ROM. Was taking Celebrex and had edema but now better. Breathing ok but gets SOB when walking vigorously. Using CPAP now. No orthopnea. BP labile. No CP. Had Chicken Noodle Soup last night.   Labs 05/2016 K 3.9, Creatinine 0.89  Past Medical History:  Diagnosis Date  . Allergy   . Chronic diastolic heart failure ([place])   . Depression   . Diverticulitis    colon  . Hyperlipidemia   . Hypertension   . Obesity   . Ovarian cyst   . Urinary incontinence     Current Outpatient Medications  Medication Sig Dispense Refill  . albuterol (PROVENTIL HFA;VENTOLIN HFA) 108 (90 BASE) MCG/ACT inhaler Inhale 2 puffs into the lungs every 6 (six) hours as needed for wheezing or shortness of breath.     . Ascorbic Acid (VITAMIN C ADULT GUMMIES) 125 MG CHEW Chew 250 mg by mouth daily.    . B Complex Vitamins (B COMPLEX 1 PO) Take 3 Units by mouth daily.    . cetirizine (ZYRTEC) 10 MG tablet Take 10 mg by mouth at bedtime.      Marland Kitchen etanercept (ENBREL) 50 MG/ML injection Inject 50 mg into the skin every 14 (fourteen) days.     . fluticasone  (FLONASE) 50 MCG/ACT nasal spray Place 1 spray into both nostrils daily as needed.  5  . folic acid (FOLVITE) 1 MG tablet Take 1 mg by mouth daily.     . furosemide (LASIX) 40 MG tablet TAKE 2 TABLETS BY MOUTH EVERY DAY (TAKE ADDITIONAL TABLET IF HOME WEIGHT IS ABOVE 230 LBS) 225 tablet 3  . ibuprofen (ADVIL,MOTRIN) 200 MG tablet Take 200 mg by mouth every 6 (six) hours as needed for pain.    Marland Kitchen KLOR-CON 10 10 MEQ tablet TAKE 2 TABLETS BY MOUTH EVERY DAY 180 tablet 3  . methotrexate (RHEUMATREX) 2.5 MG tablet Take 10 mg by mouth once a week.     . montelukast (SINGULAIR) 10 MG tablet Take 1 tablet (10 mg total) by mouth at bedtime. 90 tablet 3  . Multiple Vitamins-Minerals (EMERGEN-C VITAMIN C PO) Take 1 tablet by mouth daily as needed (immunity).    . Omega-3 1000 MG CAPS Take 2,000 mg by mouth daily.    Marland Kitchen oxyCODONE-acetaminophen (PERCOCET/ROXICET) 5-325 MG tablet Take 1 tablet by mouth as needed for severe pain.    Marland Kitchen rOPINIRole (REQUIP) 1 MG tablet Take 1 tablet (1 mg total) by mouth at bedtime. 90 tablet 3  . tiZANidine (ZANAFLEX) 4 MG capsule Take 4 mg by mouth 3 (three) times daily as needed  for muscle spasms.     No current facility-administered medications for this encounter.      Allergies  Allergen Reactions  . Diclofenac Other (See Comments)    Retains fluid     PHYSICAL EXAM: Vitals:   10/20/17 1334  BP: (!) 144/86  Pulse: 91  SpO2: 96%  Weight: 236 lb 2.6 oz (107.1 kg)   Wt Readings from Last 3 Encounters:  10/20/17 236 lb 2.6 oz (107.1 kg)  07/06/16 227 lb (103 kg)  05/07/15 220 lb 8 oz (100 kg)    General:  Well appearing. No resp difficulty HEENT: normal Neck: supple. no JVD. Carotids 2+ bilat; no bruits. No lymphadenopathy or thryomegaly appreciated. Cor: PMI nondisplaced. Regular rate & rhythm. No rubs, gallops or murmurs. Lungs: clear Abdomen: obese soft, nontender, nondistended. No hepatosplenomegaly. No bruits or masses. Good bowel sounds. Extremities: no  cyanosis, clubbing, rash, tr-1+ edema Neuro: alert & orientedx3, cranial nerves grossly intact. moves all 4 extremities w/o difficulty. Affect pleasant  ECG: NSR 80 No ST-T wave abnormalities.    ASSESSMENT & PLAN:  1. Chronic diastolic HF - Doing well overall. Volume status minimally elevated.Continue sliding scale diuretics - will add spiro 12.5 to help with BP and volume - Reinforced fluid restriction to < 2 L daily, sodium restriction to less than 2000 mg daily, and the importance of daily weights.   - Repeat echo 2. HTN - Mildly elevated. Start spiro as above 3. Obesity - Has gained about 15-20 pounds over past 2-3 years - lap band removed 2/18  - encouraged exercise and weight loss    Glori Bickers, MD  2:04 PM

## 2017-10-20 NOTE — Addendum Note (Signed)
Encounter addended by: Darron Doom, RN on: 10/20/2017 2:28 PM  Actions taken: Order list changed, Diagnosis association updated

## 2017-10-27 ENCOUNTER — Other Ambulatory Visit (HOSPITAL_COMMUNITY): Payer: Self-pay

## 2017-11-02 ENCOUNTER — Other Ambulatory Visit (HOSPITAL_COMMUNITY): Payer: Self-pay | Admitting: Cardiology

## 2017-11-20 ENCOUNTER — Other Ambulatory Visit: Payer: Self-pay | Admitting: Internal Medicine

## 2017-11-21 ENCOUNTER — Other Ambulatory Visit: Payer: Self-pay

## 2017-11-21 ENCOUNTER — Ambulatory Visit (INDEPENDENT_AMBULATORY_CARE_PROVIDER_SITE_OTHER): Payer: Medicare Other

## 2017-11-21 DIAGNOSIS — I5032 Chronic diastolic (congestive) heart failure: Secondary | ICD-10-CM | POA: Diagnosis not present

## 2017-11-21 LAB — BASIC METABOLIC PANEL WITH GFR
BUN/Creatinine Ratio: 18 (ref 12–28)
BUN: 15 mg/dL (ref 8–27)
CO2: 24 mmol/L (ref 20–29)
Calcium: 9.2 mg/dL (ref 8.7–10.3)
Chloride: 98 mmol/L (ref 96–106)
Creatinine, Ser: 0.83 mg/dL (ref 0.57–1.00)
GFR calc Af Amer: 83 mL/min/1.73
GFR calc non Af Amer: 72 mL/min/1.73
Glucose: 108 mg/dL — ABNORMAL HIGH (ref 65–99)
Potassium: 3.6 mmol/L (ref 3.5–5.2)
Sodium: 142 mmol/L (ref 134–144)

## 2018-09-13 DIAGNOSIS — D235 Other benign neoplasm of skin of trunk: Secondary | ICD-10-CM

## 2018-10-17 ENCOUNTER — Other Ambulatory Visit (HOSPITAL_COMMUNITY): Payer: Self-pay | Admitting: Internal Medicine

## 2018-11-04 ENCOUNTER — Other Ambulatory Visit (HOSPITAL_COMMUNITY): Payer: Self-pay | Admitting: Cardiology

## 2018-11-04 ENCOUNTER — Other Ambulatory Visit (HOSPITAL_COMMUNITY): Payer: Self-pay | Admitting: Internal Medicine

## 2019-07-10 ENCOUNTER — Other Ambulatory Visit (HOSPITAL_COMMUNITY): Payer: Self-pay | Admitting: Internal Medicine

## 2019-09-03 DIAGNOSIS — Z87440 Personal history of urinary (tract) infections: Secondary | ICD-10-CM | POA: Insufficient documentation

## 2019-09-16 ENCOUNTER — Telehealth (HOSPITAL_COMMUNITY): Payer: Self-pay | Admitting: Vascular Surgery

## 2019-09-16 NOTE — Telephone Encounter (Signed)
Returned pt call to make f/un appt

## 2019-10-23 ENCOUNTER — Other Ambulatory Visit (HOSPITAL_COMMUNITY): Payer: Self-pay | Admitting: Internal Medicine

## 2019-10-28 DIAGNOSIS — M545 Low back pain, unspecified: Secondary | ICD-10-CM | POA: Insufficient documentation

## 2019-11-10 ENCOUNTER — Other Ambulatory Visit (HOSPITAL_COMMUNITY): Payer: Self-pay | Admitting: Internal Medicine

## 2019-11-18 ENCOUNTER — Encounter: Payer: Medicare Other | Admitting: Dermatology

## 2019-11-18 ENCOUNTER — Encounter (HOSPITAL_COMMUNITY): Payer: Medicare Other | Admitting: Internal Medicine

## 2020-01-30 ENCOUNTER — Encounter: Payer: Medicare Other | Admitting: Dermatology

## 2020-02-03 ENCOUNTER — Encounter (HOSPITAL_COMMUNITY): Payer: Medicare Other | Admitting: Internal Medicine

## 2020-02-03 NOTE — Progress Notes (Signed)
Advanced Heart Failure Clinic Note   Patient ID: Katrina Andrews, female   DOB: 01/16/1946, 74 y.o.   MRN: [id number]  PCP: Ethelene Hal PA-C ([facility])  HPI:  Katrina Andrews is a 74 y/o woman with a h/o obesity (previously on Phen-fen. S/p Lap Band 2009), HTN, rheumatoid arthritis, OSA (on CPAP), diastolic HF and dyspnea. She had a Myoview in 2008 which was normal.  Admitted to [facility] in Nov 2012 with LLL PNA and diastolic HF. pBNP 1900. Echo EF 50-55% trivial TR. RV normal. No comment on diastolic parameters. Repeat BNP in November 2012 was normal.   Echo 6/19 EF 55-65% Grade I DD  We have not seen her since 5/19. Doing pretty well. BP well controlled except when she goes for her spinal shots. Otherwise BP 125-135/70-80s. Fluid well managed with spiro.Mild DOE. No orthopnea or PND. Wears CPAP at night.  Struggles with back pain, arthritis and neuropathy in feet.    Past Medical History:  Diagnosis Date  . Allergy   . Chronic diastolic heart failure ([place])   . Depression   . Diverticulitis    colon  . Dysplastic nevus of trunk 2007  . Hyperlipidemia   . Hypertension   . Obesity   . Ovarian cyst   . Urinary incontinence     Current Outpatient Medications  Medication Sig Dispense Refill  . Acetaminophen (TYLENOL ARTHRITIS PAIN PO) Take 1 tablet by mouth at bedtime. Take extra as needed during the day    . albuterol (PROVENTIL HFA;VENTOLIN HFA) 108 (90 BASE) MCG/ACT inhaler Inhale 2 puffs into the lungs every 6 (six) hours as needed for wheezing or shortness of breath.     . Ascorbic Acid (VITAMIN C ADULT GUMMIES) 125 MG CHEW Chew 250 mg by mouth daily.    Marland Kitchen atorvastatin (LIPITOR) 40 MG tablet Take 20 mg by mouth daily.    . B Complex Vitamins (B COMPLEX 1 PO) Take 3 Units by mouth daily.    . Biotin w/ Vitamins C & E (HAIR SKIN & NAILS GUMMIES PO) Take 2 tablets by mouth daily.    . cholecalciferol (VITAMIN D3) 25 MCG (1000 UNIT) tablet Take 3,000 Units by mouth daily.    Marland Kitchen  ELDERBERRY PO Take 2 tablets by mouth daily.    . fluticasone (FLONASE) 50 MCG/ACT nasal spray Place 1 spray into both nostrils daily as needed.  5  . folic acid (FOLVITE) 1 MG tablet Take 1 mg by mouth daily.     . furosemide (LASIX) 40 MG tablet TAKE 2 TABLETS BY MOUTH EVERY DAY (TAKE ADDITIONAL TABLET IF HOME WEIGHT IS ABOVE 230 LBS) 225 tablet 3  . gabapentin (NEURONTIN) 300 MG capsule Take 300 mg by mouth. Take 1 tab in afternoon and 2 tabs at bedtime    . ibuprofen (ADVIL,MOTRIN) 200 MG tablet Take 200 mg by mouth every 6 (six) hours as needed for pain.    . methotrexate (RHEUMATREX) 2.5 MG tablet Take 10 mg by mouth once a week.     . montelukast (SINGULAIR) 10 MG tablet Take 1 tablet (10 mg total) by mouth at bedtime. 90 tablet 3  . Multiple Vitamins-Minerals (EMERGEN-C VITAMIN C PO) Take 1 tablet by mouth daily as needed (immunity).    . Omega-3 1000 MG CAPS Take 2,000 mg by mouth daily.    . potassium chloride (KLOR-CON) 10 MEQ tablet Take 1 tablet (10 mEq total) by mouth daily. Must keep pending for further refills 90 tablet 0  .  rOPINIRole (REQUIP) 1 MG tablet Take 2 mg by mouth at bedtime.    Marland Kitchen spironolactone (ALDACTONE) 25 MG tablet TAKE 1/2 TABLETS BY MOUTH DAILY 45 tablet 3  . tiZANidine (ZANAFLEX) 4 MG capsule Take 4 mg by mouth 3 (three) times daily as needed for muscle spasms.     No current facility-administered medications for this encounter.     Allergies  Allergen Reactions  . Diclofenac Other (See Comments)    Retains fluid     PHYSICAL EXAM: Vitals:   02/04/20 1110  BP: 132/68  Pulse: 83  SpO2: 96%  Weight: 109 kg (240 lb 4 oz)   Wt Readings from Last 3 Encounters:  02/04/20 109 kg (240 lb 4 oz)  10/20/17 107.1 kg (236 lb 2.6 oz)  07/06/16 103 kg (227 lb)    General:  Well appearing. No resp difficulty HEENT: normal Neck: supple. no JVD. Carotids 2+ bilat; no bruits. No lymphadenopathy or thryomegaly appreciated. Cor: PMI nondisplaced. Regular rate &  rhythm. No rubs, gallops or murmurs. Lungs: clear Abdomen: obese soft, nontender, nondistended. No hepatosplenomegaly. No bruits or masses. Good bowel sounds. Extremities: no cyanosis, clubbing, rash, edema Neuro: alert & orientedx3, cranial nerves grossly intact. moves all 4 extremities w/o difficulty. Affect pleasant   ECG: NSR 77 No ST-T wave abnormalities. Personally reviewed   ASSESSMENT & PLAN:  1. Chronic diastolic HF - Echo 7/09 EF 55-65% Grade I DD - Doing well. NYHA II - Volume status stable on lasix 40 day and spiro 12.5 daily - We discussed switching to Jardiance given recent HFpEF data but she has done very well and we have decided to defer.  - Will turn over her care to a more local provider at [facility] - can come see me at any time for f/u  - recent blood work ok on 11/09/19. Reviewed personally in [place]  2. HTN - Blood pressure well controlled. Continue current regimen. 3. Obesity - lap band removed 2/18  - encouraged exercise and weight loss  4. OSA - Continue CPAP   Glori Bickers, MD  11:28 AM

## 2020-02-04 ENCOUNTER — Other Ambulatory Visit: Payer: Self-pay

## 2020-02-04 ENCOUNTER — Ambulatory Visit (HOSPITAL_COMMUNITY)
Admission: RE | Admit: 2020-02-04 | Discharge: 2020-02-04 | Disposition: A | Discharge status: Home / Self Care | Payer: Medicare Other | Source: Ambulatory Visit | Attending: Internal Medicine | Admitting: Internal Medicine

## 2020-02-04 VITALS — BP 132/68 | HR 83 | Wt 240.2 lb

## 2020-02-04 DIAGNOSIS — G629 Polyneuropathy, unspecified: Secondary | ICD-10-CM | POA: Insufficient documentation

## 2020-02-04 DIAGNOSIS — E785 Hyperlipidemia, unspecified: Secondary | ICD-10-CM | POA: Diagnosis not present

## 2020-02-04 DIAGNOSIS — I10 Essential (primary) hypertension: Secondary | ICD-10-CM

## 2020-02-04 DIAGNOSIS — Z791 Long term (current) use of non-steroidal anti-inflammatories (NSAID): Secondary | ICD-10-CM | POA: Diagnosis not present

## 2020-02-04 DIAGNOSIS — E669 Obesity, unspecified: Secondary | ICD-10-CM | POA: Diagnosis not present

## 2020-02-04 DIAGNOSIS — Z86018 Personal history of other benign neoplasm: Secondary | ICD-10-CM | POA: Diagnosis not present

## 2020-02-04 DIAGNOSIS — Z79899 Other long term (current) drug therapy: Secondary | ICD-10-CM | POA: Diagnosis not present

## 2020-02-04 DIAGNOSIS — Z9884 Bariatric surgery status: Secondary | ICD-10-CM | POA: Insufficient documentation

## 2020-02-04 DIAGNOSIS — Z888 Allergy status to other drugs, medicaments and biological substances status: Secondary | ICD-10-CM | POA: Insufficient documentation

## 2020-02-04 DIAGNOSIS — G4733 Obstructive sleep apnea (adult) (pediatric): Secondary | ICD-10-CM | POA: Diagnosis not present

## 2020-02-04 DIAGNOSIS — I110 Hypertensive heart disease with heart failure: Secondary | ICD-10-CM | POA: Insufficient documentation

## 2020-02-04 DIAGNOSIS — M069 Rheumatoid arthritis, unspecified: Secondary | ICD-10-CM | POA: Insufficient documentation

## 2020-02-04 DIAGNOSIS — Z8701 Personal history of pneumonia (recurrent): Secondary | ICD-10-CM | POA: Diagnosis not present

## 2020-02-04 DIAGNOSIS — I5032 Chronic diastolic (congestive) heart failure: Secondary | ICD-10-CM | POA: Diagnosis present

## 2020-02-04 NOTE — Addendum Note (Signed)
Encounter addended by: Malena Edman, RN on: 02/04/2020 11:42 AM  Actions taken: Clinical Note Signed

## 2020-02-04 NOTE — Patient Instructions (Signed)
Follow up with [place] only if needed!!  If you have any questions or concerns before your next appointment please send [place] a message through [place] or call our office at [phone].    TO LEAVE A MESSAGE FOR THE NURSE SELECT OPTION 2, PLEASE LEAVE A MESSAGE INCLUDING: . YOUR NAME . DATE OF BIRTH . CALL BACK NUMBER . REASON FOR CALL**this is important as we prioritize the call backs  [place] AS LONG AS YOU CALL BEFORE 4:00 PM  At the [facility], you and your health needs are our priority. As part of our continuing mission to provide you with exceptional heart care, we have created designated Provider Care Teams. These Care Teams include your primary Cardiologist (physician) and Advanced Practice Providers (APPs- Physician Assistants and Nurse Practitioners) who all work together to provide you with the care you need, when you need it.   You may see any of the following providers on your designated Care Team at your next follow up: Marland Kitchen Dr Glori Bickers . Dr Loralie Champagne . Darrick Grinder, NP . Lyda Jester, PA . Audry Riles, PharmD   Please be sure to bring in all your medications bottles to every appointment.

## 2020-02-10 ENCOUNTER — Other Ambulatory Visit (HOSPITAL_COMMUNITY): Payer: Self-pay | Admitting: Internal Medicine

## 2020-05-04 DIAGNOSIS — M1A9XX1 Chronic gout, unspecified, with tophus (tophi): Secondary | ICD-10-CM | POA: Insufficient documentation

## 2020-06-16 ENCOUNTER — Other Ambulatory Visit (HOSPITAL_COMMUNITY): Payer: Self-pay | Admitting: Internal Medicine

## 2020-07-21 ENCOUNTER — Telehealth (HOSPITAL_COMMUNITY): Payer: Self-pay | Admitting: *Deleted

## 2020-07-21 DIAGNOSIS — M47817 Spondylosis without myelopathy or radiculopathy, lumbosacral region: Secondary | ICD-10-CM | POA: Insufficient documentation

## 2020-07-21 DIAGNOSIS — M76899 Other specified enthesopathies of unspecified lower limb, excluding foot: Secondary | ICD-10-CM | POA: Insufficient documentation

## 2020-07-21 DIAGNOSIS — E782 Mixed hyperlipidemia: Secondary | ICD-10-CM | POA: Insufficient documentation

## 2020-07-21 DIAGNOSIS — M47812 Spondylosis without myelopathy or radiculopathy, cervical region: Secondary | ICD-10-CM | POA: Insufficient documentation

## 2020-07-21 NOTE — Telephone Encounter (Signed)
Pt aware.

## 2020-07-21 NOTE — Telephone Encounter (Signed)
-----   Message from Jolaine Artist, MD sent at 07/21/2020  2:15 PM EST ----- Regarding: RE: referral He would be fine. Dr. Doran Clay also good if he is close enough  ----- Message ----- From: Harvie Junior, CMA Sent: 07/21/2020   1:18 PM EST To: Jolaine Artist, MD Subject: referral                                       Pt left vm stating Dr.Bensimhon was going to tell her the name of a cardiologist in [facility]. Pt said she found Dr.Vakani with [facility] and wants to know if Dr.Bensimhon would recommend another practice/provider.

## 2020-07-22 ENCOUNTER — Other Ambulatory Visit (HOSPITAL_COMMUNITY): Payer: Self-pay | Admitting: Internal Medicine

## 2020-08-01 LAB — EXTERNAL GENERIC LAB PROCEDURE: COLOGUARD: NEGATIVE

## 2020-09-17 ENCOUNTER — Other Ambulatory Visit (HOSPITAL_COMMUNITY): Payer: Self-pay | Admitting: Cardiology

## 2020-11-04 ENCOUNTER — Other Ambulatory Visit (HOSPITAL_COMMUNITY): Payer: Self-pay | Admitting: Cardiology

## 2021-03-04 ENCOUNTER — Ambulatory Visit: Payer: Medicare Other | Admitting: Dermatology

## 2021-03-15 ENCOUNTER — Encounter: Payer: Self-pay | Admitting: Dermatology

## 2021-03-15 ENCOUNTER — Other Ambulatory Visit: Payer: Self-pay

## 2021-03-15 ENCOUNTER — Ambulatory Visit (INDEPENDENT_AMBULATORY_CARE_PROVIDER_SITE_OTHER): Payer: Medicare Other | Admitting: Dermatology

## 2021-03-15 DIAGNOSIS — L814 Other melanin hyperpigmentation: Secondary | ICD-10-CM

## 2021-03-15 DIAGNOSIS — I8393 Asymptomatic varicose veins of bilateral lower extremities: Secondary | ICD-10-CM

## 2021-03-15 DIAGNOSIS — D229 Melanocytic nevi, unspecified: Secondary | ICD-10-CM | POA: Diagnosis not present

## 2021-03-15 DIAGNOSIS — L578 Other skin changes due to chronic exposure to nonionizing radiation: Secondary | ICD-10-CM | POA: Diagnosis not present

## 2021-03-15 DIAGNOSIS — Z1283 Encounter for screening for malignant neoplasm of skin: Secondary | ICD-10-CM | POA: Diagnosis not present

## 2021-03-15 DIAGNOSIS — L821 Other seborrheic keratosis: Secondary | ICD-10-CM

## 2021-03-15 DIAGNOSIS — Z86018 Personal history of other benign neoplasm: Secondary | ICD-10-CM

## 2021-03-15 DIAGNOSIS — D1800 Hemangioma unspecified site: Secondary | ICD-10-CM | POA: Diagnosis not present

## 2021-03-15 DIAGNOSIS — L720 Epidermal cyst: Secondary | ICD-10-CM

## 2021-03-15 NOTE — Progress Notes (Signed)
   Follow-Up Visit   Subjective  Katrina Andrews is a 75 y.o. female who presents for the following: Annual Exam (HxDN 2007. Concerned with some areas on back and chest. ). The patient presents for Total-Body Skin Exam (TBSE) for skin cancer screening and mole check.   The following portions of the chart were reviewed this encounter and updated as appropriate:  Tobacco  Allergies  Meds  Problems  Med Hx  Surg Hx  Fam Hx     Review of Systems: No other skin or systemic complaints except as noted in HPI or Assessment and Plan.  Objective  Well appearing patient in no apparent distress; mood and affect are within normal limits.  A full examination was performed including scalp, head, eyes, ears, nose, lips, neck, chest, axillae, abdomen, back, buttocks, bilateral upper extremities, bilateral lower extremities, hands, feet, fingers, toes, fingernails, and toenails. All findings within normal limits unless otherwise noted below.   Assessment & Plan  Skin cancer screening  Lentigines - Scattered tan macules - Due to sun exposure - Benign-appearing, observe - Recommend daily broad spectrum sunscreen SPF 30+ to sun-exposed areas, reapply every 2 hours as needed. - Call for any changes  Seborrheic Keratoses - Stuck-on, waxy, tan-brown papules and/or plaques  - Benign-appearing - Discussed benign etiology and prognosis. - Observe - Call for any changes  Melanocytic Nevi - Tan-brown and/or pink-flesh-colored symmetric macules and papules - Benign appearing on exam today - Observation - Call clinic for new or changing moles - Recommend daily use of broad spectrum spf 30+ sunscreen to sun-exposed areas.   Hemangiomas - Red papules - Discussed benign nature - Observe - Call for any changes  Actinic Damage - Chronic condition, secondary to cumulative UV/sun exposure - diffuse scaly erythematous macules with underlying dyspigmentation - Recommend daily broad spectrum sunscreen  SPF 30+ to sun-exposed areas, reapply every 2 hours as needed.  - Staying in the shade or wearing long sleeves, sun glasses (UVA+UVB protection) and wide brim hats (4-inch brim around the entire circumference of the hat) are also recommended for sun protection.  - Call for new or changing lesions.  Skin cancer screening performed today.  History of Dysplastic Nevus left back - No evidence of recurrence today - Recommend regular full body skin exams - Recommend daily broad spectrum sunscreen SPF 30+ to sun-exposed areas, reapply every 2 hours as needed.  - Call if any new or changing lesions are noted between office visits   Varicose Veins/Spider Veins - Dilated blue, purple or red veins at the lower extremities - Reassured - Smaller vessels can be treated by sclerotherapy (a procedure to inject a medicine into the veins to make them disappear) if desired, but the treatment is not covered by insurance. Larger vessels may be covered if symptomatic and we would refer to vascular surgeon if treatment desired.   Milia - tiny firm white papules - type of cyst - benign - may be extracted if symptomatic - observe   Return in about 1 year (around 03/15/2022) for TBSE.  I, Emelia Salisbury, CMA, am acting as scribe for Sarina Ser, MD. Documentation: I have reviewed the above documentation for accuracy and completeness, and I agree with the above.  Sarina Ser, MD

## 2021-03-15 NOTE — Patient Instructions (Signed)

## 2021-03-16 ENCOUNTER — Encounter: Payer: Self-pay | Admitting: Dermatology

## 2021-06-28 ENCOUNTER — Other Ambulatory Visit (HOSPITAL_COMMUNITY): Payer: Self-pay | Admitting: Internal Medicine

## 2021-10-03 ENCOUNTER — Other Ambulatory Visit (HOSPITAL_COMMUNITY): Payer: Self-pay | Admitting: Internal Medicine

## 2021-11-17 ENCOUNTER — Encounter: Payer: Self-pay | Admitting: Dermatology

## 2021-11-17 ENCOUNTER — Ambulatory Visit (INDEPENDENT_AMBULATORY_CARE_PROVIDER_SITE_OTHER): Payer: Medicare Other | Admitting: Dermatology

## 2021-11-17 DIAGNOSIS — L578 Other skin changes due to chronic exposure to nonionizing radiation: Secondary | ICD-10-CM

## 2021-11-17 DIAGNOSIS — L02212 Cutaneous abscess of back [any part, except buttock]: Secondary | ICD-10-CM | POA: Diagnosis not present

## 2021-11-17 DIAGNOSIS — L820 Inflamed seborrheic keratosis: Secondary | ICD-10-CM | POA: Diagnosis not present

## 2021-11-17 DIAGNOSIS — L0291 Cutaneous abscess, unspecified: Secondary | ICD-10-CM

## 2021-11-17 DIAGNOSIS — L821 Other seborrheic keratosis: Secondary | ICD-10-CM

## 2021-11-17 MED ORDER — MUPIROCIN 2 % EX OINT: TOPICAL_OINTMENT | CUTANEOUS | 0 refills | Status: DC

## 2021-11-17 MED ORDER — DOXYCYCLINE HYCLATE 100 MG PO TABS: ORAL_TABLET | ORAL | 0 refills | Status: DC

## 2021-11-17 NOTE — Patient Instructions (Signed)
Due to recent changes in healthcare laws, you may see results of your pathology and/or laboratory studies on MyChart before the doctors have had a chance to review them. We understand that in some cases there may be results that are confusing or concerning to you. Please understand that not all results are received at the same time and often the doctors may need to interpret multiple results in order to provide you with the best plan of care or course of treatment. Therefore, we ask that you please give us 2 business days to thoroughly review all your results before contacting the office for clarification. Should we see a critical lab result, you will be contacted sooner.   If You Need Anything After Your Visit  If you have any questions or concerns for your doctor, please call our main line at [phone] and press option 4 to reach your doctor's medical assistant. If no one answers, please leave a voicemail as directed and we will return your call as soon as possible. Messages left after 4 pm will be answered the following business day.   You may also send us a message via MyChart. We typically respond to MyChart messages within 1-2 business days.  For prescription refills, please ask your pharmacy to contact our office. Our fax number is [phone].  If you have an urgent issue when the clinic is closed that cannot wait until the next business day, you can page your doctor at the number below.    Please note that while we do our best to be available for urgent issues outside of office hours, we are not available 24/7.   If you have an urgent issue and are unable to reach us, you may choose to seek medical care at your doctor's office, retail clinic, urgent care center, or emergency room.  If you have a medical emergency, please immediately call 911 or go to the emergency department.  Pager Numbers  - Dr. Kowalski: [phone]  - Dr. Moye: [phone]  - Dr. Stewart:  [phone]  In the event of inclement weather, please call our main line at [phone] for an update on the status of any delays or closures.  Dermatology Medication Tips: Please keep the boxes that topical medications come in in order to help keep track of the instructions about where and how to use these. Pharmacies typically print the medication instructions only on the boxes and not directly on the medication tubes.   If your medication is too expensive, please contact our office at [phone] option 4 or send us a message through MyChart.   We are unable to tell what your co-pay for medications will be in advance as this is different depending on your insurance coverage. However, we may be able to find a substitute medication at lower cost or fill out paperwork to get insurance to cover a needed medication.   If a prior authorization is required to get your medication covered by your insurance company, please allow us 1-2 business days to complete this process.  Drug prices often vary depending on where the prescription is filled and some pharmacies may offer cheaper prices.  The website www.goodrx.com contains coupons for medications through different pharmacies. The prices here do not account for what the cost may be with help from insurance (it may be cheaper with your insurance), but the website can give you the price if you did not use any insurance.  - You can print the associated coupon and take it with   your prescription to the pharmacy.  - You may also stop by our office during regular business hours and pick up a GoodRx coupon card.  - If you need your prescription sent electronically to a different pharmacy, notify our office through [place] MyChart or by phone at [phone] option 4.     Si Usted Necesita Algo Despus de Su Visita  Tambin puede enviarnos un mensaje a travs de MyChart. Por lo general respondemos a los mensajes de MyChart en el transcurso de 1 a 2  das hbiles.  Para renovar recetas, por favor pida a su farmacia que se ponga en contacto con nuestra oficina. Nuestro nmero de fax es el [phone].  Si tiene un asunto urgente cuando la clnica est cerrada y que no puede esperar hasta el siguiente da hbil, puede llamar/localizar a su doctor(a) al nmero que aparece a continuacin.   Por favor, tenga en cuenta que aunque hacemos todo lo posible para estar disponibles para asuntos urgentes fuera del horario de oficina, no estamos disponibles las 24 horas del da, los 7 das de la semana.   Si tiene un problema urgente y no puede comunicarse con nosotros, puede optar por buscar atencin mdica  en el consultorio de su doctor(a), en una clnica privada, en un centro de atencin urgente o en una sala de emergencias.  Si tiene una emergencia mdica, por favor llame inmediatamente al 911 o vaya a la sala de emergencias.  Nmeros de bper  - Dr. Kowalski: [phone]  - Dra. Moye: [phone]  - Dra. Stewart: [phone]  En caso de inclemencias del tiempo, por favor llame a nuestra lnea principal al [phone] para una actualizacin sobre el estado de cualquier retraso o cierre.  Consejos para la medicacin en dermatologa: Por favor, guarde las cajas en las que vienen los medicamentos de uso tpico para ayudarle a seguir las instrucciones sobre dnde y cmo usarlos. Las farmacias generalmente imprimen las instrucciones del medicamento slo en las cajas y no directamente en los tubos del medicamento.   Si su medicamento es muy caro, por favor, pngase en contacto con nuestra oficina llamando al [phone] y presione la opcin 4 o envenos un mensaje a travs de MyChart.   No podemos decirle cul ser su copago por los medicamentos por adelantado ya que esto es diferente dependiendo de la cobertura de su seguro. Sin embargo, es posible que podamos encontrar un medicamento sustituto a menor costo o llenar un formulario para que el  seguro cubra el medicamento que se considera necesario.   Si se requiere una autorizacin previa para que su compaa de seguros cubra su medicamento, por favor permtanos de 1 a 2 das hbiles para completar este proceso.  Los precios de los medicamentos varan con frecuencia dependiendo del lugar de dnde se surte la receta y alguna farmacias pueden ofrecer precios ms baratos.  El sitio web www.goodrx.com tiene cupones para medicamentos de diferentes farmacias. Los precios aqu no tienen en cuenta lo que podra costar con la ayuda del seguro (puede ser ms barato con su seguro), pero el sitio web puede darle el precio si no utiliz ningn seguro.  - Puede imprimir el cupn correspondiente y llevarlo con su receta a la farmacia.  - Tambin puede pasar por nuestra oficina durante el horario de atencin regular y recoger una tarjeta de cupones de GoodRx.  - Si necesita que su receta se enve electrnicamente a una farmacia diferente, informe a nuestra oficina a travs de MyChart de [place]   o por telfono llamando al [phone] y presione la opcin 4.  

## 2021-11-17 NOTE — Progress Notes (Signed)
Follow-Up Visit   Subjective  Katrina Andrews is a 76 y.o. female who presents for the following: Irregular skin lesion (On the back - accidentally hit lesion on door and it was painful). She also has a lesion on the L scalp that was treated in the past with LN2, but never fully resolved. The patient has spots, moles and lesions to be evaluated, some may be new or changing and the patient has concerns that these could be cancer.  The following portions of the chart were reviewed this encounter and updated as appropriate:   Tobacco  Allergies  Meds  Problems  Med Hx  Surg Hx  Fam Hx     Review of Systems:  No other skin or systemic complaints except as noted in HPI or Assessment and Plan.  Objective  Well appearing patient in no apparent distress; mood and affect are within normal limits.  A focused examination was performed including the scalp, face, and back. Relevant physical exam findings are noted in the Assessment and Plan.  L scalp x 1 Erythematous stuck-on, waxy papule or plaque  Mid back spinal Inflamed fluctuant nodule.    Assessment & Plan  Inflamed seborrheic keratosis L scalp x 1 Symptomatic, irritating, patient would like treated.  Destruction of lesion - L scalp x 1 Complexity: simple   Destruction method: cryotherapy   Informed consent: discussed and consent obtained   Timeout:  patient name, date of birth, surgical site, and procedure verified Lesion destroyed using liquid nitrogen: Yes   Region frozen until ice ball extended beyond lesion: Yes   Outcome: patient tolerated procedure well with no complications   Post-procedure details: wound care instructions given    Abscess Mid back spinal  Start Doxycycline '100mg'$  po BID x 1 week. Doxycycline should be taken with food to prevent nausea. Do not lay down for 30 minutes after taking. Be cautious with sun exposure and use good sun protection while on this medication. Pregnant women should not take this  medication.   Start Mupirocin 2% ointment to aa's QD during dressing change.   Incision and Drainage - Mid back spinal - complicated Location: Mid back spinal  Informed Consent: Discussed risks (permanent scarring, light or dark discoloration, infection, pain, bleeding, bruising, redness, damage to adjacent structures, and recurrence of the lesion) and benefits of the procedure, as well as the alternatives.  Informed consent was obtained.  Preparation: The area was prepped with alcohol.  Anesthesia: Lidocaine 1% with epinephrine  Procedure Details: An incision was made overlying the lesion. The lesion drained pus and blood.  A small amount of fluid was drained.    Antibiotic ointment and a sterile pressure dressing were applied. The patient tolerated procedure well.  Total number of lesions drained: 1  Plan: The patient was instructed on post-op care. Recommend OTC analgesia as needed for pain.  doxycycline (VIBRA-TABS) 100 MG tablet - Mid back spinal Take one tab po BID x 1 week. Take with food.  mupirocin ointment (BACTROBAN) 2 % - Mid back spinal Apply to wound QD during dressing change.  Seborrheic Keratoses - Stuck-on, waxy, tan-brown papules and/or plaques  - Benign-appearing - Discussed benign etiology and prognosis. - Observe - Call for any changes  Actinic Damage - chronic, secondary to cumulative UV radiation exposure/sun exposure over time - diffuse scaly erythematous macules with underlying dyspigmentation - Recommend daily broad spectrum sunscreen SPF 30+ to sun-exposed areas, reapply every 2 hours as needed.  - Recommend staying in the shade  or wearing long sleeves, sun glasses (UVA+UVB protection) and wide brim hats (4-inch brim around the entire circumference of the hat). - Call for new or changing lesions.  Return for appointment as scheduled.  Luther Redo, CMA, am acting as scribe for Sarina Ser, MD . Documentation: I have reviewed the above  documentation for accuracy and completeness, and I agree with the above.  Sarina Ser, MD

## 2021-12-31 DIAGNOSIS — M1A00X1 Idiopathic chronic gout, unspecified site, with tophus (tophi): Secondary | ICD-10-CM | POA: Insufficient documentation

## 2022-03-11 DIAGNOSIS — Z170 Estrogen receptor positive status [ER+]: Secondary | ICD-10-CM | POA: Insufficient documentation

## 2022-03-17 ENCOUNTER — Ambulatory Visit (INDEPENDENT_AMBULATORY_CARE_PROVIDER_SITE_OTHER): Payer: Medicare Other | Admitting: Dermatology

## 2022-03-17 DIAGNOSIS — L814 Other melanin hyperpigmentation: Secondary | ICD-10-CM

## 2022-03-17 DIAGNOSIS — L821 Other seborrheic keratosis: Secondary | ICD-10-CM

## 2022-03-17 DIAGNOSIS — D229 Melanocytic nevi, unspecified: Secondary | ICD-10-CM

## 2022-03-17 DIAGNOSIS — C50911 Malignant neoplasm of unspecified site of right female breast: Secondary | ICD-10-CM | POA: Diagnosis not present

## 2022-03-17 DIAGNOSIS — Z1283 Encounter for screening for malignant neoplasm of skin: Secondary | ICD-10-CM

## 2022-03-17 DIAGNOSIS — L905 Scar conditions and fibrosis of skin: Secondary | ICD-10-CM | POA: Diagnosis not present

## 2022-03-17 DIAGNOSIS — L720 Epidermal cyst: Secondary | ICD-10-CM | POA: Diagnosis not present

## 2022-03-17 DIAGNOSIS — L820 Inflamed seborrheic keratosis: Secondary | ICD-10-CM | POA: Diagnosis not present

## 2022-03-17 DIAGNOSIS — L578 Other skin changes due to chronic exposure to nonionizing radiation: Secondary | ICD-10-CM

## 2022-03-17 DIAGNOSIS — D239 Other benign neoplasm of skin, unspecified: Secondary | ICD-10-CM

## 2022-03-17 DIAGNOSIS — Z86018 Personal history of other benign neoplasm: Secondary | ICD-10-CM

## 2022-03-17 NOTE — Patient Instructions (Addendum)
Cryotherapy Aftercare  Wash gently with soap and water everyday.   Apply Vaseline and Band-Aid daily until healed.     Due to recent changes in healthcare laws, you may see results of your pathology and/or laboratory studies on MyChart before the doctors have had a chance to review them. We understand that in some cases there may be results that are confusing or concerning to you. Please understand that not all results are received at the same time and often the doctors may need to interpret multiple results in order to provide you with the best plan of care or course of treatment. Therefore, we ask that you please give us 2 business days to thoroughly review all your results before contacting the office for clarification. Should we see a critical lab result, you will be contacted sooner.   If You Need Anything After Your Visit  If you have any questions or concerns for your doctor, please call our main line at [phone] and press option 4 to reach your doctor's medical assistant. If no one answers, please leave a voicemail as directed and we will return your call as soon as possible. Messages left after 4 pm will be answered the following business day.   You may also send us a message via MyChart. We typically respond to MyChart messages within 1-2 business days.  For prescription refills, please ask your pharmacy to contact our office. Our fax number is [phone].  If you have an urgent issue when the clinic is closed that cannot wait until the next business day, you can page your doctor at the number below.    Please note that while we do our best to be available for urgent issues outside of office hours, we are not available 24/7.   If you have an urgent issue and are unable to reach us, you may choose to seek medical care at your doctor's office, retail clinic, urgent care center, or emergency room.  If you have a medical emergency, please immediately call 911 or go to the  emergency department.  Pager Numbers  - Dr. Kowalski: [phone]  - Dr. Moye: [phone]  - Dr. Stewart: [phone]  In the event of inclement weather, please call our main line at [phone] for an update on the status of any delays or closures.  Dermatology Medication Tips: Please keep the boxes that topical medications come in in order to help keep track of the instructions about where and how to use these. Pharmacies typically print the medication instructions only on the boxes and not directly on the medication tubes.   If your medication is too expensive, please contact our office at [phone] option 4 or send us a message through MyChart.   We are unable to tell what your co-pay for medications will be in advance as this is different depending on your insurance coverage. However, we may be able to find a substitute medication at lower cost or fill out paperwork to get insurance to cover a needed medication.   If a prior authorization is required to get your medication covered by your insurance company, please allow us 1-2 business days to complete this process.  Drug prices often vary depending on where the prescription is filled and some pharmacies may offer cheaper prices.  The website www.goodrx.com contains coupons for medications through different pharmacies. The prices here do not account for what the cost may be with help from insurance (it may be cheaper with your insurance), but the website can   give you the price if you did not use any insurance.  - You can print the associated coupon and take it with your prescription to the pharmacy.  - You may also stop by our office during regular business hours and pick up a GoodRx coupon card.  - If you need your prescription sent electronically to a different pharmacy, notify our office through [place] MyChart or by phone at [phone] option 4.     Si Usted Necesita Algo Despus de Su Visita  Tambin puede  enviarnos un mensaje a travs de MyChart. Por lo general respondemos a los mensajes de MyChart en el transcurso de 1 a 2 das hbiles.  Para renovar recetas, por favor pida a su farmacia que se ponga en contacto con nuestra oficina. Nuestro nmero de fax es el [phone].  Si tiene un asunto urgente cuando la clnica est cerrada y que no puede esperar hasta el siguiente da hbil, puede llamar/localizar a su doctor(a) al nmero que aparece a continuacin.   Por favor, tenga en cuenta que aunque hacemos todo lo posible para estar disponibles para asuntos urgentes fuera del horario de oficina, no estamos disponibles las 24 horas del da, los 7 das de la semana.   Si tiene un problema urgente y no puede comunicarse con nosotros, puede optar por buscar atencin mdica  en el consultorio de su doctor(a), en una clnica privada, en un centro de atencin urgente o en una sala de emergencias.  Si tiene una emergencia mdica, por favor llame inmediatamente al 911 o vaya a la sala de emergencias.  Nmeros de bper  - Dr. Kowalski: [phone]  - Dra. Moye: [phone]  - Dra. Stewart: [phone]  En caso de inclemencias del tiempo, por favor llame a nuestra lnea principal al [phone] para una actualizacin sobre el estado de cualquier retraso o cierre.  Consejos para la medicacin en dermatologa: Por favor, guarde las cajas en las que vienen los medicamentos de uso tpico para ayudarle a seguir las instrucciones sobre dnde y cmo usarlos. Las farmacias generalmente imprimen las instrucciones del medicamento slo en las cajas y no directamente en los tubos del medicamento.   Si su medicamento es muy caro, por favor, pngase en contacto con nuestra oficina llamando al [phone] y presione la opcin 4 o envenos un mensaje a travs de MyChart.   No podemos decirle cul ser su copago por los medicamentos por adelantado ya que esto es diferente dependiendo de la cobertura de su seguro.  Sin embargo, es posible que podamos encontrar un medicamento sustituto a menor costo o llenar un formulario para que el seguro cubra el medicamento que se considera necesario.   Si se requiere una autorizacin previa para que su compaa de seguros cubra su medicamento, por favor permtanos de 1 a 2 das hbiles para completar este proceso.  Los precios de los medicamentos varan con frecuencia dependiendo del lugar de dnde se surte la receta y alguna farmacias pueden ofrecer precios ms baratos.  El sitio web www.goodrx.com tiene cupones para medicamentos de diferentes farmacias. Los precios aqu no tienen en cuenta lo que podra costar con la ayuda del seguro (puede ser ms barato con su seguro), pero el sitio web puede darle el precio si no utiliz ningn seguro.  - Puede imprimir el cupn correspondiente y llevarlo con su receta a la farmacia.  - Tambin puede pasar por nuestra oficina durante el horario de atencin regular y recoger una tarjeta de cupones de GoodRx.  -   Si necesita que su receta se enve electrnicamente a una farmacia diferente, informe a nuestra oficina a travs de MyChart de [place] o por telfono llamando al [phone] y presione la opcin 4.  

## 2022-03-17 NOTE — Progress Notes (Signed)
Follow-Up Visit   Subjective  Katrina Andrews is a 76 y.o. female who presents for the following: Annual Exam. Recheck her back patient had a abscess removed from her back 4 month ago, healed well. Hx of Dysplastic nevus. Patient was recently diagnosed with breast cancer of the right breast, she will be having breast cancer surgery soon.  The patient presents for Total-Body Skin Exam (TBSE) for skin cancer screening and mole check.  The patient has spots, moles and lesions to be evaluated, some may be new or changing and the patient has concerns that these could be cancer.   The following portions of the chart were reviewed this encounter and updated as appropriate:   Tobacco  Allergies  Meds  Problems  Med Hx  Surg Hx  Fam Hx     Review of Systems:  No other skin or systemic complaints except as noted in HPI or Assessment and Plan.  Objective  Well appearing patient in no apparent distress; mood and affect are within normal limits.  A full examination was performed including scalp, head, eyes, ears, nose, lips, neck, chest, axillae, abdomen, back, buttocks, bilateral upper extremities, bilateral lower extremities, hands, feet, fingers, toes, fingernails, and toenails. All findings within normal limits unless otherwise noted below.  right low back x 1, left cheek x 1, left scalp x 1  (3) (3) Stuck-on, waxy, tan-brown papules-- Discussed benign etiology and prognosis.   forehead Smooth white papule(s).   mid back spinal Well healed scar, no sign of recurrence    Assessment & Plan  Inflamed seborrheic keratosis (3) right low back x 1, left cheek x 1, left scalp x 1  (3) Symptomatic, irritating, patient would like treated.  Destruction of lesion - right low back x 1, left cheek x 1, left scalp x 1  (3) Complexity: simple   Destruction method: cryotherapy   Informed consent: discussed and consent obtained   Timeout:  patient name, date of birth, surgical site, and procedure  verified Lesion destroyed using liquid nitrogen: Yes   Region frozen until ice ball extended beyond lesion: Yes   Outcome: patient tolerated procedure well with no complications   Post-procedure details: wound care instructions given    Milia forehead Benign-appearing.  Observation.  Call clinic for new or changing moles.  Recommend daily use of broad spectrum spf 30+ sunscreen to sun-exposed areas.    Malignant neoplasm of right female breast, unspecified estrogen receptor status, unspecified site of breast [facility]) Right Breast Keep scheduled appointment with oncologist   Scar mid back spinal Scar Post abscess  The patient will observe these symptoms, and report promptly any worsening or unexpected persistence.  If well, may return prn.    Lentigines - Scattered tan macules - Due to sun exposure - Benign-appearing, observe - Recommend daily broad spectrum sunscreen SPF 30+ to sun-exposed areas, reapply every 2 hours as needed. - Call for any changes  Seborrheic Keratoses - Stuck-on, waxy, tan-brown papules and/or plaques  - Benign-appearing - Discussed benign etiology and prognosis. - Observe - Call for any changes  Melanocytic Nevi - Tan-brown and/or pink-flesh-colored symmetric macules and papules - Benign appearing on exam today - Observation - Call clinic for new or changing moles - Recommend daily use of broad spectrum spf 30+ sunscreen to sun-exposed areas.   Hemangiomas - Red papules - Discussed benign nature - Observe - Call for any changes  Actinic Damage - Chronic condition, secondary to cumulative UV/sun exposure - diffuse scaly erythematous macules  with underlying dyspigmentation - Recommend daily broad spectrum sunscreen SPF 30+ to sun-exposed areas, reapply every 2 hours as needed.  - Staying in the shade or wearing long sleeves, sun glasses (UVA+UVB protection) and wide brim hats (4-inch brim around the entire circumference of the hat) are also  recommended for sun protection.  - Call for new or changing lesions.  History of Dysplastic Nevi Back  - No evidence of recurrence today - Recommend regular full body skin exams - Recommend daily broad spectrum sunscreen SPF 30+ to sun-exposed areas, reapply every 2 hours as needed.  - Call if any new or changing lesions are noted between office visits   Skin cancer screening performed today.   Return in about 1 year (around 03/18/2023) for TBSE, hx of Dysplastic nevus .  IMarye Round, CMA, am acting as scribe for Sarina Ser, MD .  Documentation: I have reviewed the above documentation for accuracy and completeness, and I agree with the above.  Sarina Ser, MD

## 2022-03-28 ENCOUNTER — Encounter: Payer: Self-pay | Admitting: Dermatology

## 2022-07-01 ENCOUNTER — Other Ambulatory Visit (HOSPITAL_COMMUNITY): Payer: Self-pay | Admitting: Internal Medicine

## 2022-09-27 ENCOUNTER — Other Ambulatory Visit (HOSPITAL_COMMUNITY): Payer: Self-pay | Admitting: Internal Medicine

## 2022-11-12 DIAGNOSIS — R600 Localized edema: Secondary | ICD-10-CM | POA: Insufficient documentation

## 2022-11-12 DIAGNOSIS — G5793 Unspecified mononeuropathy of bilateral lower limbs: Secondary | ICD-10-CM | POA: Insufficient documentation

## 2023-03-13 DIAGNOSIS — E038 Other specified hypothyroidism: Secondary | ICD-10-CM | POA: Insufficient documentation

## 2023-03-23 ENCOUNTER — Ambulatory Visit: Payer: Medicare Other | Admitting: Dermatology

## 2023-03-24 DIAGNOSIS — Z853 Personal history of malignant neoplasm of breast: Secondary | ICD-10-CM | POA: Insufficient documentation

## 2023-03-24 DIAGNOSIS — E7800 Pure hypercholesterolemia, unspecified: Secondary | ICD-10-CM | POA: Insufficient documentation

## 2023-04-12 ENCOUNTER — Ambulatory Visit: Payer: Medicare Other | Admitting: Dermatology

## 2023-04-24 ENCOUNTER — Ambulatory Visit: Payer: Medicare Other | Admitting: Dermatology

## 2023-05-18 ENCOUNTER — Ambulatory Visit: Payer: Medicare Other | Admitting: Dermatology

## 2023-06-08 ENCOUNTER — Encounter: Payer: Self-pay | Admitting: Dermatology

## 2023-06-08 ENCOUNTER — Ambulatory Visit: Payer: Medicare Other | Admitting: Dermatology

## 2023-06-08 DIAGNOSIS — L814 Other melanin hyperpigmentation: Secondary | ICD-10-CM | POA: Diagnosis not present

## 2023-06-08 DIAGNOSIS — L720 Epidermal cyst: Secondary | ICD-10-CM

## 2023-06-08 DIAGNOSIS — L821 Other seborrheic keratosis: Secondary | ICD-10-CM

## 2023-06-08 DIAGNOSIS — L578 Other skin changes due to chronic exposure to nonionizing radiation: Secondary | ICD-10-CM

## 2023-06-08 DIAGNOSIS — L738 Other specified follicular disorders: Secondary | ICD-10-CM

## 2023-06-08 DIAGNOSIS — L249 Irritant contact dermatitis, unspecified cause: Secondary | ICD-10-CM

## 2023-06-08 DIAGNOSIS — L570 Actinic keratosis: Secondary | ICD-10-CM

## 2023-06-08 DIAGNOSIS — D229 Melanocytic nevi, unspecified: Secondary | ICD-10-CM

## 2023-06-08 DIAGNOSIS — D235 Other benign neoplasm of skin of trunk: Secondary | ICD-10-CM

## 2023-06-08 DIAGNOSIS — R234 Changes in skin texture: Secondary | ICD-10-CM

## 2023-06-08 DIAGNOSIS — Z1283 Encounter for screening for malignant neoplasm of skin: Secondary | ICD-10-CM

## 2023-06-08 DIAGNOSIS — W908XXA Exposure to other nonionizing radiation, initial encounter: Secondary | ICD-10-CM

## 2023-06-08 DIAGNOSIS — Z86018 Personal history of other benign neoplasm: Secondary | ICD-10-CM

## 2023-06-08 DIAGNOSIS — D1801 Hemangioma of skin and subcutaneous tissue: Secondary | ICD-10-CM

## 2023-06-08 MED ORDER — CLOBETASOL PROPIONATE 0.05 % EX OINT: TOPICAL_OINTMENT | CUTANEOUS | 0 refills | Status: AC

## 2023-06-08 NOTE — Progress Notes (Signed)
 Follow-Up Visit   Subjective  Katrina Andrews is a 78 y.o. female who presents for the following: Skin Cancer Screening and Full Body Skin Exam  The patient presents for Total-Body Skin Exam (TBSE) for skin cancer screening and mole check. The patient has spots, moles and lesions to be evaluated, some may be new or changing and the patient may have concern these could be cancer.  Patient c/o cracking of the feet. In the past she was seen by urgent care who prescribed an antibiotic. Pt currently using Mupirocin  2% ointment to aa daily, but is concerned and would like her feet checked.   Pt c/o irregular skin lesion on the back that she feels when she uses a back scratcher.   The following portions of the chart were reviewed this encounter and updated as appropriate: medications, allergies, medical history  Review of Systems:  No other skin or systemic complaints except as noted in HPI or Assessment and Plan.  Objective  Well appearing patient in no apparent distress; mood and affect are within normal limits.  A full examination was performed including scalp, head, eyes, ears, nose, lips, neck, chest, axillae, abdomen, back, buttocks, bilateral upper extremities, bilateral lower extremities, hands, feet, fingers, toes, fingernails, and toenails. All findings within normal limits unless otherwise noted below.   Relevant physical exam findings are noted in the Assessment and Plan.  R temple x 1, R upper cutaneous lip x 1, L cheek mid pupilary line x 1 (3) Erythematous thin papules/macules with gritty scale.   Assessment & Plan   SKIN CANCER SCREENING PERFORMED TODAY.  ACTINIC DAMAGE - Chronic condition, secondary to cumulative UV/sun exposure - diffuse scaly erythematous macules with underlying dyspigmentation - Recommend daily broad spectrum sunscreen SPF 30+ to sun-exposed areas, reapply every 2 hours as needed.  - Staying in the shade or wearing long sleeves, sun glasses (UVA+UVB  protection) and wide brim hats (4-inch brim around the entire circumference of the hat) are also recommended for sun protection.  - Call for new or changing lesions.  LENTIGINES, SEBORRHEIC KERATOSES, HEMANGIOMAS - Benign normal skin lesions - Benign-appearing - Call for any changes  MELANOCYTIC NEVI - Tan-brown and/or pink-flesh-colored symmetric macules and papules - Benign appearing on exam today - Observation - Call clinic for new or changing moles - Recommend daily use of broad spectrum spf 30+ sunscreen to sun-exposed areas.   EPIDERMAL INCLUSION CYST/Dilated pore of winer Exam: Subcutaneous nodule at the back x 2  Benign-appearing. Exam most consistent with an epidermal inclusion cyst. Discussed that a cyst is a benign growth that can grow over time and sometimes get irritated or inflamed. Recommend observation if it is not bothersome. Discussed option of surgical excision to remove it if it is growing, symptomatic, or other changes noted. Please call for new or changing lesions so they can be evaluated.  HISTORY OF DYSPLASTIC NEVUS No evidence of recurrence today Recommend regular full body skin exams Recommend daily broad spectrum sunscreen SPF 30+ to sun-exposed areas, reapply every 2 hours as needed.  Call if any new or changing lesions are noted between office visits  History of breast cancer of the R breast - lumpectomy 2023 [place] (ACTINIC KERATOSIS) (3) R temple x 1, R upper cutaneous lip x 1, L cheek mid pupilary line x 1 (3) Actinic keratoses are precancerous spots that appear secondary to cumulative UV radiation exposure/sun exposure over time. They are chronic with expected duration over 1 year. A portion of actinic keratoses will  progress to squamous cell carcinoma of the skin. It is not possible to reliably predict which spots will progress to skin cancer and so treatment is recommended to prevent development of skin cancer.  Recommend daily broad spectrum sunscreen SPF  30+ to sun-exposed areas, reapply every 2 hours as needed.  Recommend staying in the shade or wearing long sleeves, sun glasses (UVA+UVB protection) and wide brim hats (4-inch brim around the entire circumference of the hat). Call for new or changing lesions.  Destruction of lesion - R temple x 1, R upper cutaneous lip x 1, L cheek mid pupilary line x 1 (3) Complexity: simple   Destruction method: cryotherapy   Informed consent: discussed and consent obtained   Timeout:  patient name, date of birth, surgical site, and procedure verified Lesion destroyed using liquid nitrogen: Yes   Region frozen until ice ball extended beyond lesion: Yes   Outcome: patient tolerated procedure well with no complications   Post-procedure details: wound care instructions given   MULTIPLE BENIGN NEVI   LENTIGINES   ACTINIC ELASTOSIS   SEBORRHEIC KERATOSES   CHERRY ANGIOMA   IRRITANT CONTACT DERMATITIS, UNSPECIFIED TRIGGER   EIC (EPIDERMAL INCLUSION CYST)   DILATED PORE OF STANLEY OF BACK    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 If not improved after two weeks contact the office. Topical steroids (such as triamcinolone, fluocinolone, fluocinonide, mometasone, clobetasol , halobetasol, betamethasone, hydrocortisone) can cause thinning and lightening of the skin if they are used for too long in the same area. Your physician has selected the right strength medicine for your problem and area affected on the body. Please use your medication only as directed by your physician to prevent side effects.   Return in about 1 year (around 06/07/2024) for TBSE.  Katrina Andrews, CMA, am acting as scribe for Boneta Sharps, MD .   Documentation: I have reviewed the above documentation for accuracy and completeness, and  I agree with the above.  Boneta Sharps, MD

## 2023-06-08 NOTE — Patient Instructions (Signed)

## 2023-06-21 ENCOUNTER — Ambulatory Visit (INDEPENDENT_AMBULATORY_CARE_PROVIDER_SITE_OTHER): Payer: Medicare Other | Admitting: Dermatology

## 2023-06-21 ENCOUNTER — Other Ambulatory Visit: Payer: Self-pay | Admitting: Dermatology

## 2023-06-21 ENCOUNTER — Telehealth: Payer: Self-pay | Admitting: Dermatology

## 2023-06-21 DIAGNOSIS — L249 Irritant contact dermatitis, unspecified cause: Secondary | ICD-10-CM

## 2023-06-21 DIAGNOSIS — R234 Changes in skin texture: Secondary | ICD-10-CM

## 2023-06-21 DIAGNOSIS — L309 Dermatitis, unspecified: Secondary | ICD-10-CM | POA: Diagnosis not present

## 2023-06-21 MED ORDER — DOXYCYCLINE MONOHYDRATE 100 MG PO CAPS: ORAL_CAPSULE | ORAL | 0 refills | Status: DC

## 2023-06-21 NOTE — Progress Notes (Signed)
   Follow-Up Visit   Subjective  Katrina Andrews is a 78 y.o. female who presents for the following: 2 weeks recheck left plantar heel, fissuring of the skin Clobetasol ointment daily, patient report skin still painful.     The following portions of the chart were reviewed this encounter and updated as appropriate: medications, allergies, medical history  Review of Systems:  No other skin or systemic complaints except as noted in HPI or Assessment and Plan.  Objective  Well appearing patient in no apparent distress; mood and affect are within normal limits.  A focused examination was performed of the following areas: left foot    Relevant exam findings are noted in the Assessment and Plan.         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 Do not lay down for 30 minutes after taking. Be cautious with sun exposure and use good sun protection while on this medication. Pregnant women should not take this medication.    Continue Clobetasol 0.05% ointment to aa's BID, cover areas after applying, use PRN. If not improved after two weeks contact the office. Topical steroids (such as triamcinolone, fluocinolone, fluocinonide, mometasone, clobetasol, halobetasol, betamethasone, hydrocortisone) can cause thinning and lightening of the skin if they are used for too long in the same area. Your physician has selected the right strength medicine for your problem and area affected on the body. Please use your medication only as directed by your physician to prevent side effects.    DERMATITIS   Related Procedures Aerobic culture  Return in about 2 weeks (around 07/05/2023) for dermatitis .  IAngelique Holm, CMA, am acting as scribe for Elie Goody, MD .   Documentation: I have reviewed the above documentation for accuracy and completeness, and I agree with the above.  Elie Goody, MD

## 2023-06-21 NOTE — Telephone Encounter (Signed)
 Doxy prescription from today is duplicated in Epic. Called pharmacy ([facility] [phone] - [place], [place] - [address]). They only have one prescription and patient has picked it up.

## 2023-06-21 NOTE — Patient Instructions (Signed)

## 2023-06-25 ENCOUNTER — Encounter: Payer: Self-pay | Admitting: Dermatology

## 2023-06-26 ENCOUNTER — Telehealth: Payer: Self-pay

## 2023-06-26 LAB — AEROBIC CULTURE

## 2023-06-26 MED ORDER — MUPIROCIN 2 % EX OINT
1.0000 | TOPICAL_OINTMENT | Freq: Three times a day (TID) | CUTANEOUS | 0 refills | Status: DC
Start: 2023-06-26 — End: 2023-08-10

## 2023-06-26 NOTE — Telephone Encounter (Signed)
-----   Message from [place] sent at 06/26/2023 11:50 AM EST ----- Please call to share that culture grew Staphylococcus bacteria as expected. They should be treated by the doxycycline. Please send mupirocin for TID application on the wound instead of clobetasol. Thank you

## 2023-06-26 NOTE — Telephone Encounter (Signed)
Patient advised culture results, mupirocin sent in for pt to use tid, advised to finish doxycycline. Butch Penny., RMA

## 2023-07-05 ENCOUNTER — Ambulatory Visit (INDEPENDENT_AMBULATORY_CARE_PROVIDER_SITE_OTHER): Payer: Medicare Other | Admitting: Dermatology

## 2023-07-05 ENCOUNTER — Encounter: Payer: Self-pay | Admitting: Dermatology

## 2023-07-05 DIAGNOSIS — L570 Actinic keratosis: Secondary | ICD-10-CM

## 2023-07-05 DIAGNOSIS — D492 Neoplasm of unspecified behavior of bone, soft tissue, and skin: Secondary | ICD-10-CM

## 2023-07-05 NOTE — Progress Notes (Signed)
   Follow-Up Visit   Subjective  Katrina Andrews is a 77 y.o. female who presents for the following: 2 weeks f/u on the left heel, culture proven staph infection, treated with Doxycycline 100 mg 2 tablets twice a day x 2 weeks and applying Mupirocin ointment bid-tid, patient report area still painful when she touches her heel.     The following portions of the chart were reviewed this encounter and updated as appropriate: medications, allergies, medical history  Review of Systems:  No other skin or systemic complaints except as noted in HPI or Assessment and Plan.  Objective  Well appearing patient in no apparent distress; mood and affect are within normal limits.   A focused examination was performed of the following areas:   Relevant exam findings are noted in the Assessment and Plan.  Left Posterior Heel Region 6 mm fissured ulcer   Assessment & Plan     NEOPLASM OF SKIN Left Posterior Heel Region Skin / nail biopsy Type of biopsy: tangential   Informed consent: discussed and consent obtained   Timeout: patient name, date of birth, surgical site, and procedure verified   Procedure prep:  Patient was prepped and draped in usual sterile fashion Prep type:  Isopropyl alcohol Anesthesia: the lesion was anesthetized in a standard fashion   Anesthetic:  1% lidocaine w/ epinephrine 1-100,000 buffered w/ 8.4% NaHCO3 Instrument used: DermaBlade   Hemostasis achieved with: pressure and aluminum chloride   Outcome: patient tolerated procedure well   Post-procedure details: sterile dressing applied and wound care instructions given   Dressing type: bandage and petrolatum   Specimen 1 - Surgical pathology Differential Diagnosis: trauma vs infectious ulcer vs scc  Check Margins: No  If pathology show a wound patient would like a wound care center near [facility].    Return if symptoms worsen or fail to improve.  IAngelique Holm, CMA, am acting as scribe for  Elie Goody, MD .   Documentation: I have reviewed the above documentation for accuracy and completeness, and I agree with the above.  Elie Goody, MD

## 2023-07-05 NOTE — Patient Instructions (Signed)

## 2023-07-10 ENCOUNTER — Telehealth: Payer: Self-pay

## 2023-07-10 LAB — SURGICAL PATHOLOGY

## 2023-07-10 NOTE — Telephone Encounter (Signed)
Discussed pathology results. Patient voiced understanding. LN2 scheduled for 07/31/2023.

## 2023-07-10 NOTE — Telephone Encounter (Signed)
-----   Message from [place] sent at 07/10/2023  2:01 PM EST ----- Diagnosis: left posterior heel region :       HYPERTROPHIC ACTINIC KERATOSIS    Plan: please call to share that the left heel sore is a precancer. Recommend follow up to treat it

## 2023-07-31 ENCOUNTER — Ambulatory Visit (INDEPENDENT_AMBULATORY_CARE_PROVIDER_SITE_OTHER): Payer: Medicare Other | Admitting: Dermatology

## 2023-07-31 ENCOUNTER — Encounter: Payer: Self-pay | Admitting: Dermatology

## 2023-07-31 DIAGNOSIS — Z48817 Encounter for surgical aftercare following surgery on the skin and subcutaneous tissue: Secondary | ICD-10-CM

## 2023-07-31 DIAGNOSIS — Z5189 Encounter for other specified aftercare: Secondary | ICD-10-CM

## 2023-07-31 NOTE — Patient Instructions (Signed)
 Wash with soap and water once daily. Continue Vaseline Jelly or Aquaphor and band-aid daily until healed.     Due to recent changes in healthcare laws, you may see results of your pathology and/or laboratory studies on MyChart before the doctors have had a chance to review them. We understand that in some cases there may be results that are confusing or concerning to you. Please understand that not all results are received at the same time and often the doctors may need to interpret multiple results in order to provide you with the best plan of care or course of treatment. Therefore, we ask that you please give [place] 2 business days to thoroughly review all your results before contacting the office for clarification. Should we see a critical lab result, you will be contacted sooner.   If You Need Anything After Your Visit  If you have any questions or concerns for your doctor, please call our main line at [phone] and press option 4 to reach your doctor's medical assistant. If no one answers, please leave a voicemail as directed and we will return your call as soon as possible. Messages left after 4 pm will be answered the following business day.   You may also send [place] a message via MyChart. We typically respond to MyChart messages within 1-2 business days.  For prescription refills, please ask your pharmacy to contact our office. Our fax number is [phone].  If you have an urgent issue when the clinic is closed that cannot wait until the next business day, you can page your doctor at the number below.    Please note that while we do our best to be available for urgent issues outside of office hours, we are not available 24/7.   If you have an urgent issue and are unable to reach [place], you may choose to seek medical care at your doctor's office, retail clinic, urgent care center, or emergency room.  If you have a medical emergency, please immediately call 911 or go to the emergency  department.  Pager Numbers  - Dr. Gwen Pounds: [phone]  - Dr. Roseanne Reno: [phone]  - Dr. Katrinka Blazing: [phone]   In the event of inclement weather, please call our main line at [phone] for an update on the status of any delays or closures.  Dermatology Medication Tips: Please keep the boxes that topical medications come in in order to help keep track of the instructions about where and how to use these. Pharmacies typically print the medication instructions only on the boxes and not directly on the medication tubes.   If your medication is too expensive, please contact our office at [phone] option 4 or send [place] a message through MyChart.   We are unable to tell what your co-pay for medications will be in advance as this is different depending on your insurance coverage. However, we may be able to find a substitute medication at lower cost or fill out paperwork to get insurance to cover a needed medication.   If a prior authorization is required to get your medication covered by your insurance company, please allow [place] 1-2 business days to complete this process.  Drug prices often vary depending on where the prescription is filled and some pharmacies may offer cheaper prices.  The website www.goodrx.com contains coupons for medications through different pharmacies. The prices here do not account for what the cost may be with help from insurance (it may be cheaper with your insurance), but the website can give  you the price if you did not use any insurance.  - You can print the associated coupon and take it with your prescription to the pharmacy.  - You may also stop by our office during regular business hours and pick up a GoodRx coupon card.  - If you need your prescription sent electronically to a different pharmacy, notify our office through [facility] or by phone at [phone] option 4.     Si Usted Necesita Algo Despus de Su Visita  Tambin puede enviarnos  un mensaje a travs de Clinical cytogeneticist. Por lo general respondemos a los mensajes de MyChart en el transcurso de 1 a 2 das hbiles.  Para renovar recetas, por favor pida a su farmacia que se ponga en contacto con nuestra oficina. Annie Sable de fax es [place] [phone].  Si tiene un asunto urgente cuando la clnica est cerrada y que no puede esperar hasta el siguiente da hbil, puede llamar/localizar a su doctor(a) al nmero que aparece a continuacin.   Por favor, tenga en cuenta que aunque hacemos todo lo posible para estar disponibles para asuntos urgentes fuera del horario de [place], no estamos disponibles las 24 horas del da, los 7 [address]  [address] de la [place].   Si tiene un problema urgente y no puede comunicarse con nosotros, puede optar por buscar atencin mdica  en el consultorio de su doctor(a), en una clnica privada, en un centro de atencin urgente o en una sala de emergencias.  Si tiene Engineer, drilling, por favor llame inmediatamente al 911 o vaya a la sala de emergencias.  Nmeros de bper  - Dr. Gwen Pounds: [phone]  - Dra. Roseanne Reno: [phone]  - Dr. Katrinka Blazing: [phone]   En caso de inclemencias del tiempo, por favor llame a Lacy Duverney principal al [phone] para una actualizacin sobre el [place] de cualquier retraso o cierre.  Consejos para la medicacin en dermatologa: Por favor, guarde las cajas en las que vienen los medicamentos de uso tpico para ayudarle a seguir las instrucciones sobre dnde y cmo usarlos. Las farmacias generalmente imprimen las instrucciones del medicamento slo en las cajas y no directamente en los tubos del [place].   Si su medicamento es muy caro, por favor, pngase en contacto con Rolm Gala llamando al [phone] y presione la opcin 4 o envenos un mensaje a travs de Clinical cytogeneticist.   No podemos decirle cul ser su copago por los medicamentos por adelantado ya que esto es diferente dependiendo de la cobertura de su seguro. Sin  embargo, es posible que podamos encontrar un medicamento sustituto a Audiological scientist un formulario para que el seguro cubra el medicamento que se considera necesario.   Si se requiere una autorizacin previa para que su compaa de seguros [place] su medicamento, por favor permtanos de 1 a 2 das hbiles para completar [address]  Los precios de los medicamentos varan con frecuencia dependiendo del Environmental consultant de dnde se surte la receta y alguna farmacias pueden ofrecer precios ms baratos.  El sitio web www.goodrx.com tiene cupones para medicamentos de Health and safety inspector. Los precios aqu no tienen en cuenta lo que podra costar con la ayuda del seguro (puede ser ms barato con su seguro), pero el sitio web puede darle el precio si no utiliz Tourist information centre manager.  - Puede imprimir el cupn correspondiente y llevarlo con su receta a la farmacia.  - Tambin puede pasar por nuestra oficina durante el horario de atencin regular y Education officer, museum una tarjeta de cupones de GoodRx.  -  Si necesita que su receta se enve electrnicamente a Psychiatrist, informe a nuestra oficina a travs de MyChart de [place] o por telfono llamando al [phone] y presione la opcin 4.

## 2023-07-31 NOTE — Progress Notes (Signed)
   Follow-Up Visit   Subjective  Katrina Andrews is a 78 y.o. female who presents for the following: treatment of biopsy proven AK. Left posterior heel region. Bx 07/05/2023.  The patient has spots, moles and lesions to be evaluated, some may be new or changing and the patient may have concern these could be cancer.  This patient is accompanied in the office by her friend.   The following portions of the chart were reviewed this encounter and updated as appropriate: medications, allergies, medical history  Review of Systems:  No other skin or systemic complaints except as noted in HPI or Assessment and Plan.  Objective  Well appearing patient in no apparent distress; mood and affect are within normal limits.  A focused examination was performed of the following areas: Left posterior heel  Relevant physical exam findings are noted in the Assessment and Plan.  Left Lateal Posterior Heel Plantar surface Healing biopsy wound: macerated white ulceration with white opaque moist wound edges and slight fibrinoid discharge  Assessment & Plan   VISIT FOR WOUND CHECK Left Lateal Posterior Heel Plantar surface Bx proven AK.   Will recheck for residual AK once area has healed.   Continue washing area once daily with soap and water. Apply Vaseline jelly or Aquaphor and a band-aid until healed.    Return in about 2 months (around 09/28/2023) for Biopsy Follow Up.  I, Lawson Radar, CMA, am acting as scribe for Elie Goody, MD.   Documentation: I have reviewed the above documentation for accuracy and completeness, and I agree with the above.  Elie Goody, MD

## 2023-08-08 ENCOUNTER — Telehealth: Payer: Self-pay

## 2023-08-08 NOTE — Telephone Encounter (Signed)
 Patient left nurse voicemail that her foot is still in a lot of pain and she was advised to call and come in to be seen if no improvement. Would you like patient worked in at some point?

## 2023-08-09 NOTE — Telephone Encounter (Signed)
 Spoke with patient, in between waiting on my phone call back she spoke with Nettie Elm and had her schedule an appt to be seen. Patient wants to see Dr. Katrinka Blazing and states her foot is oozing. aw

## 2023-08-09 NOTE — Telephone Encounter (Signed)
Left message for patient to return my call. aw 

## 2023-08-10 ENCOUNTER — Ambulatory Visit: Admitting: Dermatology

## 2023-08-10 ENCOUNTER — Encounter: Payer: Self-pay | Admitting: Dermatology

## 2023-08-10 ENCOUNTER — Other Ambulatory Visit: Payer: Self-pay | Admitting: Dermatology

## 2023-08-10 DIAGNOSIS — L97429 Non-pressure chronic ulcer of left heel and midfoot with unspecified severity: Secondary | ICD-10-CM

## 2023-08-10 DIAGNOSIS — W908XXA Exposure to other nonionizing radiation, initial encounter: Secondary | ICD-10-CM

## 2023-08-10 DIAGNOSIS — L570 Actinic keratosis: Secondary | ICD-10-CM

## 2023-08-10 DIAGNOSIS — T148XXA Other injury of unspecified body region, initial encounter: Secondary | ICD-10-CM

## 2023-08-10 MED ORDER — MUPIROCIN 2 % EX OINT: 1.0000 | TOPICAL_OINTMENT | Freq: Three times a day (TID) | CUTANEOUS | 1 refills | Status: AC

## 2023-08-10 MED ORDER — DOXYCYCLINE MONOHYDRATE 100 MG PO CAPS: ORAL_CAPSULE | ORAL | 0 refills | Status: DC

## 2023-08-10 NOTE — Progress Notes (Signed)
   Follow-Up Visit   Subjective  Katrina Andrews is a 78 y.o. female who presents for the following: L heel painful, draining in the last five days to one week.   Pt accompanied by grandson.  The patient has spots, moles and lesions to be evaluated, some may be new or changing and the patient may have concern these could be cancer.   The following portions of the chart were reviewed this encounter and updated as appropriate: medications, allergies, medical history  Review of Systems:  No other skin or systemic complaints except as noted in HPI or Assessment and Plan.  Objective  Well appearing patient in no apparent distress; mood and affect are within normal limits.   A focused examination was performed of the following areas: L foot   Relevant exam findings are noted in the Assessment and Plan.    L zygoma x1 Pink scaly macules  Assessment & Plan   VISIT FOR WOUND CHECK Left Lateal Posterior Heel Plantar surface Bx proven AK Exam: healing ulcer with macerated edges  Pt with hx of neuropathy    Treatment plan: Culture done today. Will call with results.   Start Doxycycline 100mg  BID for 10 days .   Doxycycline should be taken with food to prevent nausea. Do not lay down for 30 minutes after taking. Be cautious with sun exposure and use good sun protection while on this medication. Pregnant women should not take this medication.    Continue washing area once daily with soap and water. Apply Vaseline jelly or Aquaphor and a band-aid until healed.    WOUND OF SKIN   Related Procedures Aerobic culture AK (ACTINIC KERATOSIS) L zygoma x1 Actinic keratoses are precancerous spots that appear secondary to cumulative UV radiation exposure/sun exposure over time. They are chronic with expected duration over 1 year. A portion of actinic keratoses will progress to squamous cell carcinoma of the skin. It is not possible to reliably predict which spots will progress to skin  cancer and so treatment is recommended to prevent development of skin cancer.  Recommend daily broad spectrum sunscreen SPF 30+ to sun-exposed areas, reapply every 2 hours as needed.  Recommend staying in the shade or wearing long sleeves, sun glasses (UVA+UVB protection) and wide brim hats (4-inch brim around the entire circumference of the hat). Call for new or changing lesions. Destruction of lesion - L zygoma x1 Complexity: simple   Destruction method: cryotherapy   Informed consent: discussed and consent obtained   Timeout:  patient name, date of birth, surgical site, and procedure verified Lesion destroyed using liquid nitrogen: Yes   Region frozen until ice ball extended beyond lesion: Yes   Cryo cycles: 1 or 2. Outcome: patient tolerated procedure well with no complications   Post-procedure details: wound care instructions given    Return for As scheduled.  Wynonia Lawman, CMA, am acting as scribe for Elie Goody, MD .   Documentation: I have reviewed the above documentation for accuracy and completeness, and I agree with the above.  Elie Goody, MD

## 2023-08-10 NOTE — Patient Instructions (Addendum)

## 2023-08-14 LAB — SPECIMEN STATUS REPORT

## 2023-08-14 LAB — AEROBIC CULTURE

## 2023-08-15 ENCOUNTER — Telehealth: Payer: Self-pay

## 2023-08-15 ENCOUNTER — Encounter: Payer: Self-pay | Admitting: Dermatology

## 2023-08-15 NOTE — Telephone Encounter (Signed)
-----   Message from [place] sent at 08/15/2023  1:38 PM EDT ----- Discussed on MyChart. Continue doxycycline if not causing side effects

## 2023-08-15 NOTE — Telephone Encounter (Signed)
 Discussed C&S results. Will continue Doxycycline, tolerating well.

## 2023-08-29 DIAGNOSIS — K580 Irritable bowel syndrome with diarrhea: Secondary | ICD-10-CM | POA: Insufficient documentation

## 2023-08-29 DIAGNOSIS — T7840XA Allergy, unspecified, initial encounter: Secondary | ICD-10-CM | POA: Insufficient documentation

## 2023-09-15 DIAGNOSIS — R443 Hallucinations, unspecified: Secondary | ICD-10-CM | POA: Insufficient documentation

## 2023-09-26 ENCOUNTER — Telehealth: Payer: Self-pay

## 2023-09-26 ENCOUNTER — Ambulatory Visit: Payer: Medicare Other | Admitting: Dermatology

## 2023-09-26 ENCOUNTER — Other Ambulatory Visit: Payer: Self-pay | Admitting: Dermatology

## 2023-09-26 ENCOUNTER — Encounter: Payer: Self-pay | Admitting: Dermatology

## 2023-09-26 DIAGNOSIS — G2581 Restless legs syndrome: Secondary | ICD-10-CM | POA: Insufficient documentation

## 2023-09-26 DIAGNOSIS — L97421 Non-pressure chronic ulcer of left heel and midfoot limited to breakdown of skin: Secondary | ICD-10-CM | POA: Diagnosis not present

## 2023-09-26 DIAGNOSIS — M542 Cervicalgia: Secondary | ICD-10-CM | POA: Insufficient documentation

## 2023-09-26 DIAGNOSIS — Z872 Personal history of diseases of the skin and subcutaneous tissue: Secondary | ICD-10-CM

## 2023-09-26 DIAGNOSIS — M791 Myalgia, unspecified site: Secondary | ICD-10-CM | POA: Insufficient documentation

## 2023-09-26 DIAGNOSIS — I5189 Other ill-defined heart diseases: Secondary | ICD-10-CM | POA: Insufficient documentation

## 2023-09-26 DIAGNOSIS — R52 Pain, unspecified: Secondary | ICD-10-CM

## 2023-09-26 DIAGNOSIS — T148XXA Other injury of unspecified body region, initial encounter: Secondary | ICD-10-CM

## 2023-09-26 DIAGNOSIS — I10 Essential (primary) hypertension: Secondary | ICD-10-CM | POA: Insufficient documentation

## 2023-09-26 MED ORDER — IBUPROFEN 600 MG PO TABS
ORAL_TABLET | ORAL | 0 refills | Status: DC
Start: 2023-09-26 — End: 2023-09-26

## 2023-09-26 NOTE — Patient Instructions (Signed)

## 2023-09-26 NOTE — Telephone Encounter (Signed)
 Peterson Brandt at [facility] had left a message for us  to call.  Called back and left her a message that we were returning her call./sh

## 2023-09-26 NOTE — Progress Notes (Signed)
   Follow-Up Visit   Subjective  Katrina Andrews is a 78 y.o. female who presents for the following: 2 month bx proven AK follow up at left heel. Patient advises she is using Vaseline and alternating with mupirocin  and covering. Patient thinks it looks the same and is still painful at times. Culture has been done with no harmful  bacteria on results.   Patient has completed course of nortriptyline for neuropathy.   The patient has spots, moles and lesions to be evaluated, some may be new or changing and the patient may have concern these could be cancer.   The following portions of the chart were reviewed this encounter and updated as appropriate: medications, allergies, medical history  Review of Systems:  No other skin or systemic complaints except as noted in HPI or Assessment and Plan.  Objective  Well appearing patient in no apparent distress; mood and affect are within normal limits.   A focused examination was performed of the following areas: Left foot  Relevant exam findings are noted in the Assessment and Plan.    Assessment & Plan   Non-healing WOUND OF SKIN BX proven AK, biopsy done 07/05/23 Exam: ulceration in location of biopsy on left lateral posterior heel plantar surface. Defect in skin is larger than what was taken for the biopsy. No discharge. Nearby spider veins    Treatment Plan: Wound healing may be impaired by prednisone  and surgery. No clear evidence of infection and last swab was negative so no clear indication for antibiotics Suspect poor perfusion is limiting wound healing. The original nonhealing fissure was likely a sign of poor perfusion and lack of wound healing. Initial insult may have been trauma. Patient traveling from 5/8 to 6/2. Burning likely from known neuropathy and open wound Patient requesting rx for ibuprofen  600 mg for pain.  patient reports that swelling only occurs when medication is taken daily, so patient takes it every other day.  Alternates with tylenol . 07/14/23 normal Creatinine BUN GFR. Discussed risks of NSAIDs including but not limited to stomach ulcers bleeding renal dysfunction. Will refer to [facility], closest to patient's home in [place]. PAIN   Related Medications ibuprofen  (ADVIL ) 600 MG tablet Take every other day as needed for moderate pain NONHEALING NONSURGICAL WOUND LIMITED TO BREAKDOWN OF SKIN   HISTORY OF ACTINIC KERATOSIS    Return for TBSE, as scheduled, with Dr. Linnell Richardson, HxDN.  Kerstin Peeling, RMA, am acting as scribe for Harris Liming, MD .   Documentation: I have reviewed the above documentation for accuracy and completeness, and I agree with the above.  Harris Liming, MD

## 2023-09-27 ENCOUNTER — Other Ambulatory Visit: Payer: Self-pay

## 2023-09-27 DIAGNOSIS — T148XXA Other injury of unspecified body region, initial encounter: Secondary | ICD-10-CM

## 2023-09-28 ENCOUNTER — Telehealth: Payer: Self-pay

## 2023-09-28 NOTE — Telephone Encounter (Signed)
 Faxed referral 09/27/23 to [phone]. Called today and LVM asking for a call back to confirm they received my fax.  Sue Em., RMA

## 2023-09-29 ENCOUNTER — Telehealth: Payer: Self-pay | Admitting: Dermatology

## 2023-09-29 ENCOUNTER — Other Ambulatory Visit: Payer: Self-pay | Admitting: Dermatology

## 2023-09-29 NOTE — Telephone Encounter (Signed)
 Called [facility] to check on referral but got answering machine

## 2023-10-02 ENCOUNTER — Telehealth: Payer: Self-pay

## 2023-10-02 NOTE — Telephone Encounter (Signed)
 I spoke with Nichole at [facility] and she said the number we were faxing the referral to was the [place] location and not the [place] location. Referral refaxed to [phone] which is the correct fax number for the [facility] location. Per Nichole's instructions I included PT evaluate and treat. Their office also sent a referral form which was filled out and faxed back.

## 2023-10-02 NOTE — Telephone Encounter (Signed)
 Patient called stating that she was frustrated that she still hasn't been scheduled for wound care of her foot. Pt states that [facility] has left several message for our office that they haven't received the referral and can't schedule her until they receive it. I advised patient that Bennet Brasil RMA, and Dr. Felipe Horton called their office and left messages to confirm fax, but never heard back from their office. I contacted [facility] at [phone] and [phone] and left a voicemail on each line for their office to return my call. Referral faxed again today.

## 2023-11-15 ENCOUNTER — Encounter: Payer: Self-pay | Admitting: Dermatology

## 2023-11-15 ENCOUNTER — Ambulatory Visit: Payer: Medicare Other | Admitting: Dermatology

## 2023-11-15 ENCOUNTER — Ambulatory Visit: Admitting: Dermatology

## 2023-11-15 DIAGNOSIS — D1801 Hemangioma of skin and subcutaneous tissue: Secondary | ICD-10-CM

## 2023-11-15 DIAGNOSIS — L570 Actinic keratosis: Secondary | ICD-10-CM | POA: Diagnosis not present

## 2023-11-15 DIAGNOSIS — L820 Inflamed seborrheic keratosis: Secondary | ICD-10-CM | POA: Diagnosis not present

## 2023-11-15 DIAGNOSIS — D229 Melanocytic nevi, unspecified: Secondary | ICD-10-CM

## 2023-11-15 DIAGNOSIS — L578 Other skin changes due to chronic exposure to nonionizing radiation: Secondary | ICD-10-CM | POA: Diagnosis not present

## 2023-11-15 DIAGNOSIS — W908XXA Exposure to other nonionizing radiation, initial encounter: Secondary | ICD-10-CM | POA: Diagnosis not present

## 2023-11-15 DIAGNOSIS — D692 Other nonthrombocytopenic purpura: Secondary | ICD-10-CM

## 2023-11-15 DIAGNOSIS — Z1283 Encounter for screening for malignant neoplasm of skin: Secondary | ICD-10-CM | POA: Diagnosis not present

## 2023-11-15 DIAGNOSIS — L821 Other seborrheic keratosis: Secondary | ICD-10-CM

## 2023-11-15 DIAGNOSIS — Z86018 Personal history of other benign neoplasm: Secondary | ICD-10-CM

## 2023-11-15 DIAGNOSIS — L814 Other melanin hyperpigmentation: Secondary | ICD-10-CM

## 2023-11-15 NOTE — Progress Notes (Signed)
 Follow-Up Visit   Subjective  Katrina Andrews is a 78 y.o. female who presents for the following: Skin Cancer Screening and Full Body Skin Exam. Hx of dysplastic nevi. No personal Hx of skin cancer.   The patient presents for Total-Body Skin Exam (TBSE) for skin cancer screening and mole check. The patient has spots, moles and lesions to be evaluated, some may be new or changing and the patient may have concern these could be cancer.  The following portions of the chart were reviewed this encounter and updated as appropriate: medications, allergies, medical history  Review of Systems:  No other skin or systemic complaints except as noted in HPI or Assessment and Plan.  Objective  Well appearing patient in no apparent distress; mood and affect are within normal limits.  A full examination was performed including scalp, head, eyes, ears, nose, lips, neck, chest, axillae, abdomen, back, buttocks, bilateral upper extremities, bilateral lower extremities, hands, feet, fingers, toes, fingernails, and toenails. All findings within normal limits unless otherwise noted below.   Relevant physical exam findings are noted in the Assessment and Plan.  Right Cheek x1 Erythematous thin papules/macules with gritty scale.  Back x20, L post thigh x1 (21) Erythematous keratotic or waxy stuck-on papule or plaque.  Assessment & Plan   SKIN CANCER SCREENING PERFORMED TODAY.  HISTORY OF DYSPLASTIC NEVUS. Torso, 2007 No evidence of recurrence today Recommend regular full body skin exams Recommend daily broad spectrum sunscreen SPF 30+ to sun-exposed areas, reapply every 2 hours as needed.  Call if any new or changing lesions are noted between office visits  ACTINIC DAMAGE - Chronic condition, secondary to cumulative UV/sun exposure - diffuse scaly erythematous macules with underlying dyspigmentation - Recommend daily broad spectrum sunscreen SPF 30+ to sun-exposed areas, reapply every 2 hours as  needed.  - Staying in the shade or wearing long sleeves, sun glasses (UVA+UVB protection) and wide brim hats (4-inch brim around the entire circumference of the hat) are also recommended for sun protection.  - Call for new or changing lesions.  LENTIGINES, SEBORRHEIC KERATOSES, HEMANGIOMAS - Benign normal skin lesions - Benign-appearing - Call for any changes  MELANOCYTIC NEVI - Tan-brown and/or pink-flesh-colored symmetric macules and papules - Benign appearing on exam today - Observation - Call clinic for new or changing moles - Recommend daily use of broad spectrum spf 30+ sunscreen to sun-exposed areas.   Purpura - Chronic; persistent and recurrent.  Treatable, but not curable. 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  Chronic and persistent condition with duration or expected duration over one year. Condition is improving with treatment but not currently at goal. Treatment: Continue follow up with wound care at [facility]  [place] (ACTINIC KERATOSIS) Right Cheek x1 Actinic keratoses are precancerous spots that appear secondary to cumulative UV radiation exposure/sun exposure over time. They are chronic with expected duration over 1 year. A portion of actinic keratoses will progress to squamous cell carcinoma of the skin. It is not possible to reliably predict which spots will progress to skin cancer and so treatment is recommended to prevent development of  skin cancer.  Recommend daily broad spectrum sunscreen SPF 30+ to sun-exposed areas, reapply every 2 hours as needed.   Recommend staying in the shade or wearing long sleeves, sun glasses (UVA+UVB protection) and wide brim hats (4-inch brim around the entire circumference of the hat). Call for new or changing lesions. Destruction of lesion - Right Cheek x1 Complexity: simple   Destruction method: cryotherapy   Informed consent: discussed and consent obtained   Timeout:  patient name, date of birth, surgical site, and procedure verified Lesion destroyed using liquid nitrogen: Yes   Region frozen until ice ball extended beyond lesion: Yes   Outcome: patient tolerated procedure well with no complications   Post-procedure details: wound care instructions given   Additional details:  Prior to procedure, discussed risks of blister formation, small wound, skin dyspigmentation, or rare scar following cryotherapy. Recommend Vaseline ointment to treated areas while healing.  INFLAMED SEBORRHEIC KERATOSIS (21) Back x20, L post thigh x1 (21) Symptomatic, irritating, patient would like treated. Destruction of lesion - Back x20, L post thigh x1 (21) Complexity: simple   Destruction method: cryotherapy   Informed consent: discussed and consent obtained   Timeout:  patient name, date of birth, surgical site, and procedure verified Lesion destroyed using liquid nitrogen: Yes   Region frozen until ice ball extended beyond lesion: Yes   Outcome: patient tolerated procedure well with no complications   Post-procedure details: wound care instructions given   Additional details:  Prior to procedure, discussed risks of blister formation, small wound, skin dyspigmentation, or rare scar following cryotherapy. Recommend Vaseline ointment to treated areas while healing.   Return in about 1 year (around 11/14/2024) for TBSE, HxDN.  I, Jill Parcell, CMA, am acting as scribe for Celine Collard, MD.   Documentation: I have reviewed the above documentation for accuracy and completeness, and I agree with the above.  Celine Collard,  MD

## 2023-11-15 NOTE — Patient Instructions (Addendum)
 Cryotherapy Aftercare  Wash gently with soap and water everyday.   Apply Vaseline Jelly daily until healed, if desired.     Recommend daily broad spectrum sunscreen SPF 30+ to sun-exposed areas, reapply every 2 hours as needed. Call for new or changing lesions.  Staying in the shade or wearing long sleeves, sun glasses (UVA+UVB protection) and wide brim hats (4-inch brim around the entire circumference of the hat) are also recommended for sun protection.      Melanoma ABCDEs  Melanoma is the most dangerous type of skin cancer, and is the leading cause of death from skin disease.  You are more likely to develop melanoma if you: Have light-colored skin, light-colored eyes, or red or blond hair Spend a lot of time in the sun Tan regularly, either outdoors or in a tanning bed Have had blistering sunburns, especially during childhood Have a close family member who has had a melanoma Have atypical moles or large birthmarks  Early detection of melanoma is key since treatment is typically straightforward and cure rates are extremely high if we catch it early.   The first sign of melanoma is often a change in a mole or a new dark spot.  The ABCDE system is a way of remembering the signs of melanoma.  A for asymmetry:  The two halves do not match. B for border:  The edges of the growth are irregular. C for color:  A mixture of colors are present instead of an even brown color. D for diameter:  Melanomas are usually (but not always) greater than 6mm - the size of a pencil eraser. E for evolution:  The spot keeps changing in size, shape, and color.  Please check your skin once per month between visits. You can use a small mirror in front and a large mirror behind you to keep an eye on the back side or your body.   If you see any new or changing lesions before your next follow-up, please call to schedule a visit.  Please continue daily skin protection including broad spectrum sunscreen SPF  30+ to sun-exposed areas, reapplying every 2 hours as needed when you're outdoors.   Staying in the shade or wearing long sleeves, sun glasses (UVA+UVB protection) and wide brim hats (4-inch brim around the entire circumference of the hat) are also recommended for sun protection.      Due to recent changes in healthcare laws, you may see results of your pathology and/or laboratory studies on MyChart before the doctors have had a chance to review them. We understand that in some cases there may be results that are confusing or concerning to you. Please understand that not all results are received at the same time and often the doctors may need to interpret multiple results in order to provide you with the best plan of care or course of treatment. Therefore, we ask that you please give us  2 business days to thoroughly review all your results before contacting the office for clarification. Should we see a critical lab result, you will be contacted sooner.   If You Need Anything After Your Visit  If you have any questions or concerns for your doctor, please call our main line at [phone] and press option 4 to reach your doctor's medical assistant. If no one answers, please leave a voicemail as directed and we will return your call as soon as possible. Messages left after 4 pm will be answered the following business day.   You may  also send us  a message via MyChart. We typically respond to MyChart messages within 1-2 business days.  For prescription refills, please ask your pharmacy to contact our office. Our fax number is [phone].  If you have an urgent issue when the clinic is closed that cannot wait until the next business day, you can page your doctor at the number below.    Please note that while we do our best to be available for urgent issues outside of office hours, we are not available 24/7.   If you have an urgent issue and are unable to reach us , you may choose to seek medical  care at your doctor's office, retail clinic, urgent care center, or emergency room.  If you have a medical emergency, please immediately call 911 or go to the emergency department.  Pager Numbers  - Dr. Bary Likes: [phone]  - Dr. Annette Barters: [phone]  - Dr. Felipe Horton: [phone]   In the event of inclement weather, please call our main line at [phone] for an update on the status of any delays or closures.  Dermatology Medication Tips: Please keep the boxes that topical medications come in in order to help keep track of the instructions about where and how to use these. Pharmacies typically print the medication instructions only on the boxes and not directly on the medication tubes.   If your medication is too expensive, please contact our office at [phone] option 4 or send us  a message through MyChart.   We are unable to tell what your co-pay for medications will be in advance as this is different depending on your insurance coverage. However, we may be able to find a substitute medication at lower cost or fill out paperwork to get insurance to cover a needed medication.   If a prior authorization is required to get your medication covered by your insurance company, please allow us  1-2 business days to complete this process.  Drug prices often vary depending on where the prescription is filled and some pharmacies may offer cheaper prices.  The website www.goodrx.com contains coupons for medications through different pharmacies. The prices here do not account for what the cost may be with help from insurance (it may be cheaper with your insurance), but the website can give you the price if you did not use any insurance.  - You can print the associated coupon and take it with your prescription to the pharmacy.  - You may also stop by our office during regular business hours and pick up a GoodRx coupon card.  - If you need your prescription sent electronically to a different  pharmacy, notify our office through [facility] or by phone at [phone] option 4.     Si Usted Necesita Algo Despus de Su Visita  Tambin puede enviarnos un mensaje a travs de Clinical cytogeneticist. Por lo general respondemos a los mensajes de MyChart en el transcurso de 1 a 2 das hbiles.  Para renovar recetas, por favor pida a su farmacia que se ponga en contacto con nuestra oficina. Franz Jacks de fax es [place] [phone].  Si tiene un asunto urgente cuando la clnica est cerrada y que no puede esperar hasta el siguiente da hbil, puede llamar/localizar a su doctor(a) al nmero que aparece a continuacin.   Por favor, tenga en cuenta que aunque hacemos todo lo posible para estar disponibles para asuntos urgentes fuera del horario de [place], no estamos disponibles las 24 horas del da, los 7 [address]  [address] de la [place].  Si tiene un problema urgente y no puede comunicarse con nosotros, puede optar por buscar atencin mdica  en el consultorio de su doctor(a), en una clnica privada, en un centro de atencin urgente o en una sala de emergencias.  Si tiene Engineer, drilling, por favor llame inmediatamente al 911 o vaya a la sala de emergencias.  Nmeros de bper  - Dr. Bary Likes: [phone]  - Dra. Annette Barters: [phone]  - Dr. Felipe Horton: [phone]   En caso de inclemencias del tiempo, por favor llame a Lajuan Pila principal al [phone] para una actualizacin sobre el [place] de cualquier retraso o cierre.  Consejos para la medicacin en dermatologa: Por favor, guarde las cajas en las que vienen los medicamentos de uso tpico para ayudarle a seguir las instrucciones sobre dnde y cmo usarlos. Las farmacias generalmente imprimen las instrucciones del medicamento slo en las cajas y no directamente en los tubos del [place].   Si su medicamento es muy caro, por favor, pngase en contacto con Bettyjane Brunet llamando al [phone] y presione la opcin 4 o envenos un mensaje a  travs de Clinical cytogeneticist.   No podemos decirle cul ser su copago por los medicamentos por adelantado ya que esto es diferente dependiendo de la cobertura de su seguro. Sin embargo, es posible que podamos encontrar un medicamento sustituto a Audiological scientist un formulario para que el seguro cubra el medicamento que se considera necesario.   Si se requiere una autorizacin previa para que su compaa de seguros [place] su medicamento, por favor permtanos de 1 a 2 das hbiles para completar este proceso.  Los precios de los medicamentos varan con frecuencia dependiendo del Environmental consultant de dnde se surte la receta y alguna farmacias pueden ofrecer precios ms baratos.  El sitio web www.goodrx.com tiene cupones para medicamentos de Health and safety inspector. Los precios aqu no tienen en cuenta lo que podra costar con la ayuda del seguro (puede ser ms barato con su seguro), pero el sitio web puede darle el precio si no utiliz Tourist information centre manager.  - Puede imprimir el cupn correspondiente y llevarlo con su receta a la farmacia.  - Tambin puede pasar por nuestra oficina durante el horario de atencin regular y Education officer, museum una tarjeta de cupones de GoodRx.  - Si necesita que su receta se enve electrnicamente a una farmacia diferente, informe a nuestra oficina a travs de MyChart de [place] o por telfono llamando al [phone] y presione la opcin 4.

## 2023-12-21 ENCOUNTER — Other Ambulatory Visit: Payer: Self-pay | Admitting: Dermatology

## 2023-12-21 DIAGNOSIS — R52 Pain, unspecified: Secondary | ICD-10-CM

## 2024-06-13 ENCOUNTER — Ambulatory Visit: Payer: Medicare Other | Admitting: Dermatology

## 2024-11-14 ENCOUNTER — Ambulatory Visit: Admitting: Dermatology
